# Patient Record
Sex: Female | Born: 1940 | Race: White | Hispanic: No | Marital: Married | Smoking: Former smoker
Health system: Southern US, Community
[De-identification: ages and names within clinical notes are randomized; demographics above are authoritative.]

## PROBLEM LIST (undated history)

## (undated) DIAGNOSIS — I82409 Acute embolism and thrombosis of unspecified deep veins of unspecified lower extremity: Secondary | ICD-10-CM

## (undated) DIAGNOSIS — I10 Essential (primary) hypertension: Secondary | ICD-10-CM

## (undated) DIAGNOSIS — N289 Disorder of kidney and ureter, unspecified: Secondary | ICD-10-CM

## (undated) DIAGNOSIS — I639 Cerebral infarction, unspecified: Secondary | ICD-10-CM

## (undated) DIAGNOSIS — E119 Type 2 diabetes mellitus without complications: Secondary | ICD-10-CM

## (undated) DIAGNOSIS — I5032 Chronic diastolic (congestive) heart failure: Secondary | ICD-10-CM

## (undated) DIAGNOSIS — I482 Chronic atrial fibrillation, unspecified: Secondary | ICD-10-CM

---

## 2008-06-05 ENCOUNTER — Ambulatory Visit: Payer: Self-pay | Admitting: Internal Medicine

## 2009-07-31 ENCOUNTER — Ambulatory Visit: Payer: Self-pay | Admitting: Family Medicine

## 2013-11-01 DIAGNOSIS — K551 Chronic vascular disorders of intestine: Secondary | ICD-10-CM | POA: Insufficient documentation

## 2014-03-18 ENCOUNTER — Ambulatory Visit: Payer: Self-pay | Admitting: Family Medicine

## 2015-05-01 ENCOUNTER — Other Ambulatory Visit: Payer: Self-pay | Admitting: Family Medicine

## 2015-05-01 DIAGNOSIS — Z1231 Encounter for screening mammogram for malignant neoplasm of breast: Secondary | ICD-10-CM

## 2015-05-04 ENCOUNTER — Ambulatory Visit
Admission: RE | Admit: 2015-05-04 | Discharge: 2015-05-04 | Disposition: A | Discharge status: Home / Self Care | Payer: Medicare Other | Source: Ambulatory Visit | Attending: Family Medicine | Admitting: Family Medicine

## 2015-05-04 DIAGNOSIS — Z1231 Encounter for screening mammogram for malignant neoplasm of breast: Secondary | ICD-10-CM | POA: Insufficient documentation

## 2017-12-03 DIAGNOSIS — N183 Chronic kidney disease, stage 3 unspecified: Secondary | ICD-10-CM | POA: Insufficient documentation

## 2019-06-04 DIAGNOSIS — Z86718 Personal history of other venous thrombosis and embolism: Secondary | ICD-10-CM | POA: Insufficient documentation

## 2019-09-07 DIAGNOSIS — I701 Atherosclerosis of renal artery: Secondary | ICD-10-CM | POA: Insufficient documentation

## 2019-12-28 DIAGNOSIS — K922 Gastrointestinal hemorrhage, unspecified: Secondary | ICD-10-CM | POA: Insufficient documentation

## 2020-02-19 ENCOUNTER — Emergency Department: Payer: Medicare Other

## 2020-02-19 ENCOUNTER — Inpatient Hospital Stay
Admission: EM | Admit: 2020-02-19 | Discharge: 2020-02-23 | DRG: 481 | Disposition: A | Discharge status: Skilled Nursing Facility | Payer: Medicare Other | Source: Skilled Nursing Facility | Attending: Internal Medicine | Admitting: Internal Medicine

## 2020-02-19 ENCOUNTER — Other Ambulatory Visit: Payer: Self-pay

## 2020-02-19 DIAGNOSIS — Z7984 Long term (current) use of oral hypoglycemic drugs: Secondary | ICD-10-CM

## 2020-02-19 DIAGNOSIS — I110 Hypertensive heart disease with heart failure: Secondary | ICD-10-CM | POA: Diagnosis present

## 2020-02-19 DIAGNOSIS — S72002A Fracture of unspecified part of neck of left femur, initial encounter for closed fracture: Secondary | ICD-10-CM | POA: Diagnosis present

## 2020-02-19 DIAGNOSIS — E785 Hyperlipidemia, unspecified: Secondary | ICD-10-CM | POA: Diagnosis present

## 2020-02-19 DIAGNOSIS — I639 Cerebral infarction, unspecified: Secondary | ICD-10-CM | POA: Diagnosis present

## 2020-02-19 DIAGNOSIS — E041 Nontoxic single thyroid nodule: Secondary | ICD-10-CM | POA: Diagnosis present

## 2020-02-19 DIAGNOSIS — E119 Type 2 diabetes mellitus without complications: Secondary | ICD-10-CM | POA: Diagnosis present

## 2020-02-19 DIAGNOSIS — Y92129 Unspecified place in nursing home as the place of occurrence of the external cause: Secondary | ICD-10-CM | POA: Diagnosis not present

## 2020-02-19 DIAGNOSIS — Z20822 Contact with and (suspected) exposure to covid-19: Secondary | ICD-10-CM | POA: Diagnosis present

## 2020-02-19 DIAGNOSIS — S72142A Displaced intertrochanteric fracture of left femur, initial encounter for closed fracture: Principal | ICD-10-CM | POA: Diagnosis present

## 2020-02-19 DIAGNOSIS — F0390 Unspecified dementia without behavioral disturbance: Secondary | ICD-10-CM | POA: Diagnosis not present

## 2020-02-19 DIAGNOSIS — Z79899 Other long term (current) drug therapy: Secondary | ICD-10-CM

## 2020-02-19 DIAGNOSIS — G309 Alzheimer's disease, unspecified: Secondary | ICD-10-CM | POA: Diagnosis not present

## 2020-02-19 DIAGNOSIS — R52 Pain, unspecified: Secondary | ICD-10-CM

## 2020-02-19 DIAGNOSIS — Z66 Do not resuscitate: Secondary | ICD-10-CM | POA: Diagnosis present

## 2020-02-19 DIAGNOSIS — I7090 Unspecified atherosclerosis: Secondary | ICD-10-CM

## 2020-02-19 DIAGNOSIS — Z86718 Personal history of other venous thrombosis and embolism: Secondary | ICD-10-CM

## 2020-02-19 DIAGNOSIS — I10 Essential (primary) hypertension: Secondary | ICD-10-CM | POA: Diagnosis present

## 2020-02-19 DIAGNOSIS — D62 Acute posthemorrhagic anemia: Secondary | ICD-10-CM | POA: Diagnosis not present

## 2020-02-19 DIAGNOSIS — M79662 Pain in left lower leg: Secondary | ICD-10-CM | POA: Diagnosis not present

## 2020-02-19 DIAGNOSIS — Z8673 Personal history of transient ischemic attack (TIA), and cerebral infarction without residual deficits: Secondary | ICD-10-CM | POA: Diagnosis not present

## 2020-02-19 DIAGNOSIS — I5032 Chronic diastolic (congestive) heart failure: Secondary | ICD-10-CM | POA: Diagnosis present

## 2020-02-19 DIAGNOSIS — I482 Chronic atrial fibrillation, unspecified: Secondary | ICD-10-CM | POA: Diagnosis present

## 2020-02-19 DIAGNOSIS — W010XXA Fall on same level from slipping, tripping and stumbling without subsequent striking against object, initial encounter: Secondary | ICD-10-CM | POA: Diagnosis present

## 2020-02-19 DIAGNOSIS — Z87891 Personal history of nicotine dependence: Secondary | ICD-10-CM

## 2020-02-19 DIAGNOSIS — W19XXXA Unspecified fall, initial encounter: Secondary | ICD-10-CM | POA: Diagnosis present

## 2020-02-19 DIAGNOSIS — F32A Depression, unspecified: Secondary | ICD-10-CM | POA: Diagnosis present

## 2020-02-19 DIAGNOSIS — K219 Gastro-esophageal reflux disease without esophagitis: Secondary | ICD-10-CM | POA: Diagnosis present

## 2020-02-19 DIAGNOSIS — D649 Anemia, unspecified: Secondary | ICD-10-CM | POA: Diagnosis present

## 2020-02-19 DIAGNOSIS — Z8249 Family history of ischemic heart disease and other diseases of the circulatory system: Secondary | ICD-10-CM

## 2020-02-19 DIAGNOSIS — Z419 Encounter for procedure for purposes other than remedying health state, unspecified: Secondary | ICD-10-CM

## 2020-02-19 DIAGNOSIS — Z888 Allergy status to other drugs, medicaments and biological substances status: Secondary | ICD-10-CM | POA: Diagnosis not present

## 2020-02-19 DIAGNOSIS — F0280 Dementia in other diseases classified elsewhere without behavioral disturbance: Secondary | ICD-10-CM | POA: Diagnosis not present

## 2020-02-19 DIAGNOSIS — F0150 Vascular dementia without behavioral disturbance: Secondary | ICD-10-CM | POA: Diagnosis present

## 2020-02-19 HISTORY — DX: Cerebral infarction, unspecified: I63.9

## 2020-02-19 HISTORY — DX: Chronic diastolic (congestive) heart failure: I50.32

## 2020-02-19 HISTORY — DX: Acute embolism and thrombosis of unspecified deep veins of unspecified lower extremity: I82.409

## 2020-02-19 HISTORY — DX: Essential (primary) hypertension: I10

## 2020-02-19 HISTORY — DX: Type 2 diabetes mellitus without complications: E11.9

## 2020-02-19 HISTORY — DX: Disorder of kidney and ureter, unspecified: N28.9

## 2020-02-19 HISTORY — DX: Chronic atrial fibrillation, unspecified: I48.20

## 2020-02-19 LAB — CBC WITH DIFFERENTIAL/PLATELET
Abs Immature Granulocytes: 0.03 10*3/uL (ref 0.00–0.07)
Basophils Absolute: 0.1 10*3/uL (ref 0.0–0.1)
Basophils Relative: 1 %
Eosinophils Absolute: 0.9 10*3/uL — ABNORMAL HIGH (ref 0.0–0.5)
Eosinophils Relative: 13 %
HCT: 31.4 % — ABNORMAL LOW (ref 36.0–46.0)
Hemoglobin: 9.3 g/dL — ABNORMAL LOW (ref 12.0–15.0)
Immature Granulocytes: 0 %
Lymphocytes Relative: 22 %
Lymphs Abs: 1.5 10*3/uL (ref 0.7–4.0)
MCH: 26.1 pg (ref 26.0–34.0)
MCHC: 29.6 g/dL — ABNORMAL LOW (ref 30.0–36.0)
MCV: 88.2 fL (ref 80.0–100.0)
Monocytes Absolute: 0.7 10*3/uL (ref 0.1–1.0)
Monocytes Relative: 10 %
Neutro Abs: 3.7 10*3/uL (ref 1.7–7.7)
Neutrophils Relative %: 54 %
Platelets: 290 10*3/uL (ref 150–400)
RBC: 3.56 MIL/uL — ABNORMAL LOW (ref 3.87–5.11)
RDW: 18 % — ABNORMAL HIGH (ref 11.5–15.5)
WBC: 6.8 10*3/uL (ref 4.0–10.5)
nRBC: 0 % (ref 0.0–0.2)

## 2020-02-19 LAB — COMPREHENSIVE METABOLIC PANEL
ALT: 11 U/L (ref 0–44)
AST: 17 U/L (ref 15–41)
Albumin: 3.2 g/dL — ABNORMAL LOW (ref 3.5–5.0)
Alkaline Phosphatase: 87 U/L (ref 38–126)
Anion gap: 12 (ref 5–15)
BUN: 16 mg/dL (ref 8–23)
CO2: 23 mmol/L (ref 22–32)
Calcium: 8.7 mg/dL — ABNORMAL LOW (ref 8.9–10.3)
Chloride: 106 mmol/L (ref 98–111)
Creatinine, Ser: 0.96 mg/dL (ref 0.44–1.00)
GFR, Estimated: >60 mL/min (ref >60)
Glucose, Bld: 160 mg/dL — ABNORMAL HIGH (ref 70–99)
Potassium: 3.7 mmol/L (ref 3.5–5.1)
Sodium: 141 mmol/L (ref 135–145)
Total Bilirubin: 0.5 mg/dL (ref 0.3–1.2)
Total Protein: 6.6 g/dL (ref 6.5–8.1)

## 2020-02-19 LAB — CBG MONITORING, ED: Glucose-Capillary: 166 mg/dL — ABNORMAL HIGH (ref 70–99)

## 2020-02-19 LAB — RESP PANEL BY RT-PCR (FLU A&B, COVID) ARPGX2
Influenza A by PCR: NEGATIVE
Influenza B by PCR: NEGATIVE
SARS Coronavirus 2 by RT PCR: NEGATIVE

## 2020-02-19 LAB — APTT: aPTT: 29 seconds (ref 24–36)

## 2020-02-19 LAB — MRSA PCR SCREENING: MRSA by PCR: NEGATIVE

## 2020-02-19 LAB — GLUCOSE, CAPILLARY
Glucose-Capillary: 143 mg/dL — ABNORMAL HIGH (ref 70–99)
Glucose-Capillary: 169 mg/dL — ABNORMAL HIGH (ref 70–99)

## 2020-02-19 LAB — PROTIME-INR
INR: 1.2 (ref 0.8–1.2)
Prothrombin Time: 14.6 seconds (ref 11.4–15.2)

## 2020-02-19 LAB — BRAIN NATRIURETIC PEPTIDE: B Natriuretic Peptide: 1080 pg/mL — ABNORMAL HIGH (ref 0.0–100.0)

## 2020-02-19 MED ORDER — DONEPEZIL HCL 5 MG PO TABS
10.0000 mg | ORAL_TABLET | Freq: Every day | ORAL | Status: DC
  Administered 2020-02-19 – 2020-02-22 (×4): 10 mg via ORAL
  Filled 2020-02-19 (×4): qty 2

## 2020-02-19 MED ORDER — FENTANYL CITRATE (PF) 100 MCG/2ML IJ SOLN
50.0000 ug | Freq: Once | INTRAMUSCULAR | Status: AC
Start: 2020-02-19 — End: 2020-02-19
  Administered 2020-02-19: 50 ug via INTRAVENOUS
  Filled 2020-02-19: qty 2

## 2020-02-19 MED ORDER — BENAZEPRIL HCL 20 MG PO TABS
40.0000 mg | ORAL_TABLET | Freq: Every day | ORAL | Status: DC
  Administered 2020-02-19 – 2020-02-23 (×4): 40 mg via ORAL
  Filled 2020-02-19 (×5): qty 2

## 2020-02-19 MED ORDER — INSULIN ASPART 100 UNIT/ML ~~LOC~~ SOLN
0.0000 [IU] | Freq: Three times a day (TID) | SUBCUTANEOUS | Status: DC
  Administered 2020-02-19: 2 [IU] via SUBCUTANEOUS
  Filled 2020-02-19: qty 1

## 2020-02-19 MED ORDER — ONDANSETRON HCL 4 MG/2ML IJ SOLN
4.0000 mg | Freq: Three times a day (TID) | INTRAMUSCULAR | Status: DC | PRN
  Administered 2020-02-19 – 2020-02-20 (×2): 4 mg via INTRAVENOUS
  Filled 2020-02-19 (×2): qty 2

## 2020-02-19 MED ORDER — INSULIN ASPART 100 UNIT/ML ~~LOC~~ SOLN: 0.0000 [IU] | Freq: Every day | SUBCUTANEOUS | Status: DC

## 2020-02-19 MED ORDER — METFORMIN HCL ER 500 MG PO TB24
500.0000 mg | ORAL_TABLET | Freq: Every day | ORAL | Status: DC
  Administered 2020-02-21 – 2020-02-23 (×3): 500 mg via ORAL
  Filled 2020-02-19 (×5): qty 1

## 2020-02-19 MED ORDER — ATORVASTATIN CALCIUM 20 MG PO TABS
40.0000 mg | ORAL_TABLET | Freq: Every day | ORAL | Status: DC
  Administered 2020-02-19 – 2020-02-22 (×4): 40 mg via ORAL
  Filled 2020-02-19 (×4): qty 2

## 2020-02-19 MED ORDER — HYDROMORPHONE HCL 1 MG/ML IJ SOLN
1.0000 mg | Freq: Once | INTRAMUSCULAR | Status: AC
  Administered 2020-02-19: 1 mg via INTRAVENOUS
  Filled 2020-02-19: qty 1

## 2020-02-19 MED ORDER — ENSURE MAX PROTEIN PO LIQD
11.0000 [oz_av] | Freq: Every day | ORAL | Status: DC
  Administered 2020-02-21 – 2020-02-22 (×2): 11 [oz_av] via ORAL
  Filled 2020-02-19: qty 330

## 2020-02-19 MED ORDER — TRANEXAMIC ACID-NACL 1000-0.7 MG/100ML-% IV SOLN
1000.0000 mg | Freq: Once | INTRAVENOUS | Status: AC
  Administered 2020-02-19: 1000 mg via INTRAVENOUS
  Filled 2020-02-19: qty 100

## 2020-02-19 MED ORDER — MORPHINE SULFATE (PF) 2 MG/ML IV SOLN
0.5000 mg | INTRAVENOUS | Status: DC | PRN
  Administered 2020-02-19: 0.5 mg via INTRAVENOUS
  Filled 2020-02-19: qty 1

## 2020-02-19 MED ORDER — ACETAMINOPHEN 325 MG PO TABS: 650.0000 mg | ORAL_TABLET | Freq: Four times a day (QID) | ORAL | Status: DC | PRN

## 2020-02-19 MED ORDER — JUVEN PO PACK
1.0000 | PACK | Freq: Two times a day (BID) | ORAL | Status: DC
  Administered 2020-02-20 – 2020-02-23 (×7): 1 via ORAL

## 2020-02-19 MED ORDER — PANTOPRAZOLE SODIUM 40 MG PO TBEC
40.0000 mg | DELAYED_RELEASE_TABLET | Freq: Every day | ORAL | Status: DC
  Administered 2020-02-19 – 2020-02-23 (×4): 40 mg via ORAL
  Filled 2020-02-19 (×4): qty 1

## 2020-02-19 MED ORDER — CEFAZOLIN SODIUM-DEXTROSE 2-4 GM/100ML-% IV SOLN
2.0000 g | Freq: Once | INTRAVENOUS | Status: AC
  Administered 2020-02-19: 2 g via INTRAVENOUS
  Filled 2020-02-19: qty 100

## 2020-02-19 MED ORDER — OXYCODONE-ACETAMINOPHEN 5-325 MG PO TABS
1.0000 | ORAL_TABLET | ORAL | Status: DC | PRN
  Administered 2020-02-19 – 2020-02-20 (×3): 1 via ORAL
  Filled 2020-02-19 (×3): qty 1

## 2020-02-19 MED ORDER — ONDANSETRON HCL 4 MG/2ML IJ SOLN: 4.0000 mg | Freq: Once | INTRAMUSCULAR | Status: AC

## 2020-02-19 MED ORDER — ONDANSETRON HCL 4 MG/2ML IJ SOLN
INTRAMUSCULAR | Status: AC
  Administered 2020-02-19: 4 mg via INTRAVENOUS
  Filled 2020-02-19: qty 2

## 2020-02-19 MED ORDER — ACETAMINOPHEN 500 MG PO TABS
1000.0000 mg | ORAL_TABLET | Freq: Once | ORAL | Status: DC
  Filled 2020-02-19: qty 2

## 2020-02-19 MED ORDER — HYDROMORPHONE HCL 1 MG/ML IJ SOLN
0.5000 mg | Freq: Once | INTRAMUSCULAR | Status: AC
  Administered 2020-02-19: 0.5 mg via INTRAVENOUS
  Filled 2020-02-19: qty 1

## 2020-02-19 MED ORDER — SENNOSIDES-DOCUSATE SODIUM 8.6-50 MG PO TABS: 1.0000 | ORAL_TABLET | Freq: Every evening | ORAL | Status: DC | PRN

## 2020-02-19 MED ORDER — VITAMIN D 25 MCG (1000 UNIT) PO TABS
2000.0000 [IU] | ORAL_TABLET | Freq: Every day | ORAL | Status: DC
  Administered 2020-02-19 – 2020-02-23 (×4): 2000 [IU] via ORAL
  Filled 2020-02-19 (×4): qty 2

## 2020-02-19 MED ORDER — HYDROMORPHONE HCL 1 MG/ML IJ SOLN
0.5000 mg | INTRAMUSCULAR | Status: DC | PRN
  Administered 2020-02-19 – 2020-02-20 (×3): 0.5 mg via INTRAVENOUS
  Filled 2020-02-19 (×3): qty 1

## 2020-02-19 MED ORDER — ADULT MULTIVITAMIN W/MINERALS CH
1.0000 | ORAL_TABLET | Freq: Every day | ORAL | Status: DC
  Administered 2020-02-21 – 2020-02-23 (×3): 1 via ORAL
  Filled 2020-02-19 (×3): qty 1

## 2020-02-19 MED ORDER — HYDRALAZINE HCL 20 MG/ML IJ SOLN
5.0000 mg | INTRAMUSCULAR | Status: DC | PRN
  Administered 2020-02-22: 5 mg via INTRAVENOUS
  Filled 2020-02-19: qty 1

## 2020-02-19 MED ORDER — SERTRALINE HCL 50 MG PO TABS
50.0000 mg | ORAL_TABLET | Freq: Every day | ORAL | Status: DC
  Administered 2020-02-19 – 2020-02-23 (×4): 50 mg via ORAL
  Filled 2020-02-19 (×4): qty 1

## 2020-02-19 MED ORDER — INSULIN ASPART 100 UNIT/ML ~~LOC~~ SOLN: 0.0000 [IU] | SUBCUTANEOUS | Status: DC

## 2020-02-19 MED ORDER — MORPHINE SULFATE (PF) 4 MG/ML IV SOLN
4.0000 mg | Freq: Once | INTRAVENOUS | Status: AC
  Administered 2020-02-19: 4 mg via INTRAVENOUS
  Filled 2020-02-19: qty 1

## 2020-02-19 MED ORDER — METHOCARBAMOL 500 MG PO TABS
500.0000 mg | ORAL_TABLET | Freq: Three times a day (TID) | ORAL | Status: DC | PRN
  Administered 2020-02-19 – 2020-02-23 (×5): 500 mg via ORAL
  Filled 2020-02-19 (×7): qty 1

## 2020-02-19 MED ORDER — VITAMIN B-12 1000 MCG PO TABS
1000.0000 ug | ORAL_TABLET | Freq: Every day | ORAL | Status: DC
  Administered 2020-02-19: 1000 ug via ORAL
  Filled 2020-02-19: qty 1

## 2020-02-19 NOTE — ED Notes (Signed)
Patient noted to be resting. Intermittent groans and expressions of pain. Patient is no longer nauseous.

## 2020-02-19 NOTE — ED Provider Notes (Signed)
  Patient received in signout from Dr. Antoine Primas pending callback from [facility] transfer center and orthopedics for possible transfer.  Briefly, patient resides at a local SNF, mechanical fall last night causing left-sided comminuted intertrochanteric femur fracture, but family is demanding transfer to [facility] for fixation.  I speak with orthopedic surgeon at [facility], Dr. Allena Katz, who indicates that they were unable to accept transfer due to lack of bed availability for need for tertiary care facility.  I relayed this information to daughter and son of patient over the phone, suggesting and recommending admission to our facility for fixation.  They have multiple questions, but are ultimately agreeable to admission to [facility] for orthopedic fixation of her left hip.  I discussed the case with Dr. Signa Kell orthopedics, and Dr. Clyde Lundborg Hospitalist.    Clinical Course as of 02/19/20 0920  Sat Feb 19, 2020  0838 I spoke with daughter, Lethea Killings, she also conferences in her brother on the call,  [DS]  [phone] Dr. Dalene Carrow, ortho at [facility], unable to accept transfer [DS]    Clinical Course User Index [DS] Delton Prairie, MD      Delton Prairie, MD 02/19/20 [phone]

## 2020-02-19 NOTE — ED Notes (Signed)
Purewick replaced. Pt states need to void.

## 2020-02-19 NOTE — ED Notes (Addendum)
Patient's daughter and son request that patient be transferred to [facility] if she needs surgery. EDP informed.  Patient's daughter updated by this RN after receving permission from patient.  Virginia Mann has requested to be the point of contact for family: [phone].

## 2020-02-19 NOTE — Progress Notes (Signed)
Initial Nutrition Assessment  DOCUMENTATION CODES:   Not applicable  INTERVENTION:  Ensure Max po daily, each supplement provides 150 kcal and 30 grams of protein  Juven BID, each packet provides 95 calories, 2.5 grams of protein (collagen), and 9.8 grams of carbohydrate (3 grams sugar); also contains 7 grams of L-arginine and L-glutamine, 300 mg vitamin C, 15 mg vitamin E, 1.2 mcg vitamin B-12, 9.5 mg zinc, 200 mg calcium, and 1.5 g  Calcium Beta-hydroxy-Beta-methylbutyrate to support wound healing  MVI with minerals po daily  NUTRITION DIAGNOSIS:   Increased nutrient needs related to post-op healing,hip fracture as evidenced by estimated needs.    GOAL:   Patient will meet greater than or equal to 90% of their needs    MONITOR:   Labs,I & O's,Supplement acceptance,Skin,PO intake,Weight trends  REASON FOR ASSESSMENT:   Consult Assessment of nutrition requirement/status (hip fx)  ASSESSMENT:  80 year old female admitted with left hip fracture after fall at facility. Past medical history significant of HTN, HLD, DM2, stroke, GERD, depression, atrial fibrillation on anticoagulants, GIB, vascular dementia, and chronic dCHF.  Pt is s/p ORIF of left hip 2/12  Attempted to reach pt via phone, however no answer. Per notes, pt with recent arrival to floor this afternoon with 10/10 pain, prn medications given. Diet advanced to HH/CM, no documented intakes at this time. Suspect decreased appetite and intake related to pain. Pt has increased protein needs to promote post-op healing. Will order Ensure Max daily to help her meet her needs as well as Juven to support post-op wound healing.   Limited recent wt history for review. Per encounters, weights appear fairly stable over the past few months. At office visit on 10/29 she weighed 62.4 kg, office visit on 12/8 pt weighed 63.5 kg, on 12/20 [facility] hospital admission she weighed 61.9 kg and currently pt weighs 64.4 kg  Medications  reviewed and include: D3, Aricept, Metformin, Protonix, Zoloft, B12  Labs: CBGs 143,166 No A1c for review  NUTRITION - FOCUSED PHYSICAL EXAM:  Unable to complete at this time  Diet Order:   Diet Order            Diet NPO time specified Except for: Ice Chips, Sips with Meds  Diet effective midnight           Diet NPO time specified  Diet effective midnight           Diet heart healthy/carb modified Room service appropriate? Yes; Fluid consistency: Thin  Diet effective now                 EDUCATION NEEDS:   No education needs have been identified at this time  Skin:  Skin Assessment: Skin Integrity Issues: Skin Integrity Issues:: Incisions Incisions: closed; left hip  Last BM:     Height:   Ht Readings from Last 1 Encounters:  02/19/20 5\' 1"  (1.549 m)    Weight:   Wt Readings from Last 1 Encounters:  02/19/20 64.4 kg    BMI:  Body mass index is 26.83 kg/m.  Estimated Nutritional Needs:   Kcal:  1700-1900  Protein:  90-105  Fluid:  1.6 L/day    04/18/20, RD, LDN Clinical Nutrition After Hours/Weekend Pager # in Amion

## 2020-02-19 NOTE — Progress Notes (Signed)
Pt arrived to 140A, pain 10/10 prn medications given. VSS at this time, dtr at bedside.

## 2020-02-19 NOTE — ED Provider Notes (Signed)
[facility] Emergency Department Provider Note  ____________________________________________   Event Date/Time   First MD Initiated Contact with Patient 02/19/20 [phone]     (approximate)  I have reviewed the triage vital signs and the nursing notes.   HISTORY  Chief Complaint Fall   HPI Virginia Mann is a 80 y.o. female with past medical history of HTN, HDL, DM, chronic A. fib not anticoagulated and rate controlled, and vascular dementia who presents via EMS from nursing facility after she had a fall.  She states she slipped hitting her right hip and has been able to walk on the right hip since.  She denies any other pain does not think she hit her head and had LOC.  Denies pain in her right ankle, knee, or the left lower extremity, upper extremities chest abdomen back head or neck.  Patient notes she is otherwise been in her usual state of health without any recent fevers, chills, cough, vomiting, diarrhea, dysuria, rash or any other acute sick symptoms or recent falls.           Past Medical History:  Diagnosis Date  . Diabetes mellitus without complication (HCC)   . Renal disorder     There are no problems to display for this patient.   History reviewed. No pertinent surgical history.  Prior to Admission medications   Not on File    Allergies Patient has no allergy information on record.  History reviewed. No pertinent family history.  Social History Social History   Tobacco Use  . Smoking status: Former Smoker    Types: Cigarettes  . Smokeless tobacco: Never Used  Vaping Use  . Vaping Use: Never used  Substance Use Topics  . Alcohol use: Not Currently  . Drug use: Not Currently    Review of Systems  Review of Systems  Constitutional: Negative for chills and fever.  HENT: Negative for sore throat.   Eyes: Negative for pain.  Respiratory: Negative for cough and stridor.   Cardiovascular: Negative for chest pain.   Gastrointestinal: Negative for vomiting.  Musculoskeletal: Positive for joint pain ( R hip\) and myalgias ( R hip).  Skin: Negative for rash.  Neurological: Negative for seizures, loss of consciousness and headaches.  Psychiatric/Behavioral: Negative for suicidal ideas.  All other systems reviewed and are negative.     ____________________________________________   PHYSICAL EXAM:  VITAL SIGNS: ED Triage Vitals  Enc Vitals Group     BP      Pulse      Resp      Temp      Temp src      SpO2      Weight      Height      Head Circumference      Peak Flow      Pain Score      Pain Loc      Pain Edu?      Excl. in GC?    Vitals:   02/19/20 0630 02/19/20 0700  BP: (!) 162/68 (!) 161/96  Pulse: 80 65  Resp: (!) 22 12  Temp:    SpO2: 100%    Physical Exam Vitals and nursing note reviewed.  Constitutional:      General: She is not in acute distress.    Appearance: She is well-developed and well-nourished.  HENT:     Head: Normocephalic and atraumatic.     Right Ear: External ear normal.     Left Ear:  External ear normal.     Nose: Nose normal.  Eyes:     Conjunctiva/sclera: Conjunctivae normal.  Cardiovascular:     Rate and Rhythm: Normal rate and regular rhythm.     Heart sounds: No murmur heard.   Pulmonary:     Effort: Pulmonary effort is normal. No respiratory distress.     Breath sounds: Normal breath sounds.  Abdominal:     Palpations: Abdomen is soft.     Tenderness: There is no abdominal tenderness.  Musculoskeletal:        General: No edema.     Cervical back: Neck supple.     Right hip: Tenderness present. Decreased range of motion. Decreased strength.  Skin:    General: Skin is warm and dry.  Neurological:     Mental Status: She is alert.  Psychiatric:        Mood and Affect: Mood and affect normal.     No tenderness step-offs deformities over the C/T/L-spine.  2+ bilateral radial pulses.  Cranial nerves II to XII grossly intact.  Patient  has full strength and range of motion in the bilateral upper extremities and left lower extremity.  There is no tenderness effusion deformity or other overlying skin changes of the bilateral shoulders, elbows, wrists, left hip, bilateral knees or bilateral ankles.  Sensation is intact to light touch of all extremities.  Patient is oriented to year but not date. ____________________________________________   LABS (all labs ordered are listed, but only abnormal results are displayed)  Labs Reviewed  CBC WITH DIFFERENTIAL/PLATELET - Abnormal; Notable for the following components:      Result Value   RBC 3.56 (*)    Hemoglobin 9.3 (*)    HCT 31.4 (*)    MCHC 29.6 (*)    RDW 18.0 (*)    Eosinophils Absolute 0.9 (*)    All other components within normal limits  COMPREHENSIVE METABOLIC PANEL - Abnormal; Notable for the following components:   Glucose, Bld 160 (*)    Calcium 8.7 (*)    Albumin 3.2 (*)    All other components within normal limits  RESP PANEL BY RT-PCR (FLU A&B, COVID) [id number]  PROTIME-INR  TYPE AND SCREEN   ____________________________________________  EKG  A. fib with a ventricular rate of 67, normal axis, normal levels, no clear evidence of acute ischemia or other significant underlying arrhythmia. ____________________________________________  RADIOLOGY  ED MD interpretation: CT head and C-spine showed no evidence of acute injury.  Chest x-ray shows no rib fracture or pneumothorax or other clear process although is a very small area of the left lower lung base that could be atelectasis versus some scarring.  Plain film the left hip shows comminuted trochanteric fracture.  Official radiology report(s): DG Chest 1 View  Result Date: 02/19/2020 CLINICAL DATA:  80 year old female status post unwitnessed fall. Left femur fracture. EXAM: CHEST  1 VIEW COMPARISON:  None. FINDINGS: Portable AP semi upright view at 0356 hours. Normal lung volumes. Cardiac size at the upper  limits of normal. Other mediastinal contours are within normal limits. Visualized tracheal air column is within normal limits. No pneumothorax, pulmonary edema or consolidation. No definite pleural effusion or pulmonary edema. Streaky left lung base opacity near the cardiac apex might be atelectasis or scarring. Paucity of bowel gas in the upper abdomen. No acute osseous abnormality identified. IMPRESSION: 1. Small area of opacity at the left lung base is nonspecific. Atelectasis or scarring favored over infection or aspiration. 2. No other acute  cardiopulmonary abnormality. Electronically Signed   By: Odessa Fleming M.D.   On: 02/19/2020 04:33   CT Head Wo Contrast  Result Date: 02/19/2020 CLINICAL DATA:  Unwitnessed fall. EXAM: CT HEAD WITHOUT CONTRAST CT CERVICAL SPINE WITHOUT CONTRAST TECHNIQUE: Multidetector CT imaging of the head and cervical spine was performed following the standard protocol without intravenous contrast. Multiplanar CT image reconstructions of the cervical spine were also generated. COMPARISON:  None. FINDINGS: CT HEAD FINDINGS Brain: Cerebral ventricle sizes are concordant with the degree of cerebral volume loss. Chronic right occipital lobe infarction. Patchy and confluent areas of decreased attenuation are noted throughout the deep and periventricular white matter of the cerebral hemispheres bilaterally, compatible with chronic microvascular ischemic disease. No evidence of large-territorial acute infarction. No parenchymal hemorrhage. No mass lesion. No extra-axial collection. No mass effect or midline shift. No hydrocephalus. Basilar cisterns are patent. Vascular: No hyperdense vessel. Atherosclerotic calcifications are present within the cavernous internal carotid and vertebral arteries. Skull: No acute fracture or focal lesion. Sinuses/Orbits: Paranasal sinuses and mastoid air cells are clear. Bilateral lens replacement. Otherwise the orbits are unremarkable. Other: None. CT CERVICAL  SPINE FINDINGS Alignment: Normal. Skull base and vertebrae: Multilevel degenerative changes of the spine most prominent at the C5-C6 level. No acute fracture. No aggressive appearing focal osseous lesion or focal pathologic process. Soft tissues and spinal canal: No prevertebral fluid or swelling. No visible canal hematoma. Upper chest: Unremarkable. Other: A 1.7 cm peripherally calcified hypodense nodule within the left thyroid gland. Atherosclerotic plaque of the carotid arteries within the neck. Atherosclerotic plaque of the aortic arch. IMPRESSION: 1. No acute intracranial abnormality. 2. No acute displaced fracture or traumatic listhesis of the cervical spine. 3. A 1.7 cm peripherally calcified hypodense nodule within the left thyroid gland. Recommend thyroid US (ref: J Am Coll Radiol. 2015 Feb;12(2): 143-50). In the setting of significant comorbidities or limited life expectancy, no follow-up recommended (ref: J Am Coll Radiol. 2015 Feb;12(2): 143-50). 4.  Aortic Atherosclerosis (ICD10-I70.0). Electronically Signed   By: Tish Frederickson M.D.   On: 02/19/2020 05:11   CT Cervical Spine Wo Contrast  Result Date: 02/19/2020 CLINICAL DATA:  Unwitnessed fall. EXAM: CT HEAD WITHOUT CONTRAST CT CERVICAL SPINE WITHOUT CONTRAST TECHNIQUE: Multidetector CT imaging of the head and cervical spine was performed following the standard protocol without intravenous contrast. Multiplanar CT image reconstructions of the cervical spine were also generated. COMPARISON:  None. FINDINGS: CT HEAD FINDINGS Brain: Cerebral ventricle sizes are concordant with the degree of cerebral volume loss. Chronic right occipital lobe infarction. Patchy and confluent areas of decreased attenuation are noted throughout the deep and periventricular white matter of the cerebral hemispheres bilaterally, compatible with chronic microvascular ischemic disease. No evidence of large-territorial acute infarction. No parenchymal hemorrhage. No mass  lesion. No extra-axial collection. No mass effect or midline shift. No hydrocephalus. Basilar cisterns are patent. Vascular: No hyperdense vessel. Atherosclerotic calcifications are present within the cavernous internal carotid and vertebral arteries. Skull: No acute fracture or focal lesion. Sinuses/Orbits: Paranasal sinuses and mastoid air cells are clear. Bilateral lens replacement. Otherwise the orbits are unremarkable. Other: None. CT CERVICAL SPINE FINDINGS Alignment: Normal. Skull base and vertebrae: Multilevel degenerative changes of the spine most prominent at the C5-C6 level. No acute fracture. No aggressive appearing focal osseous lesion or focal pathologic process. Soft tissues and spinal canal: No prevertebral fluid or swelling. No visible canal hematoma. Upper chest: Unremarkable. Other: A 1.7 cm peripherally calcified hypodense nodule within the left thyroid gland. Atherosclerotic plaque  of the carotid arteries within the neck. Atherosclerotic plaque of the aortic arch. IMPRESSION: 1. No acute intracranial abnormality. 2. No acute displaced fracture or traumatic listhesis of the cervical spine. 3. A 1.7 cm peripherally calcified hypodense nodule within the left thyroid gland. Recommend thyroid US (ref: J Am Coll Radiol. 2015 Feb;12(2): 143-50). In the setting of significant comorbidities or limited life expectancy, no follow-up recommended (ref: J Am Coll Radiol. 2015 Feb;12(2): 143-50). 4.  Aortic Atherosclerosis (ICD10-I70.0). Electronically Signed   By: Tish FredericksonMorgane  Naveau M.D.   On: 02/19/2020 05:11   DG HIP UNILAT WITH PELVIS 2-3 VIEWS LEFT  Result Date: 02/19/2020 CLINICAL DATA:  80 year old female status post unwitnessed fall. Left hip pain. EXAM: DG HIP (WITH OR WITHOUT PELVIS) 2-3V LEFT COMPARISON:  None. FINDINGS: Extensive aortoiliac and proximal right femoral artery vascular calcifications. Femoral heads remain normally located. The pelvis appears intact. Negative visible bowel gas  pattern. Grossly intact proximal right femur. Comminuted left intertrochanteric fracture with medial displacement 1/2 shaft width, over riding of 2-3 cm, anterior displacement 1/2 shaft width and mild posterior angulation. IMPRESSION: Comminuted left intertrochanteric fracture with displacement, overriding of fragments. Electronically Signed   By: Odessa FlemingH  Hall M.D.   On: 02/19/2020 04:32    ____________________________________________   PROCEDURES  Procedure(s) performed (including Critical Care):  .1-3 Lead EKG Interpretation Performed by: Gilles ChiquitoSmith, Yaquelin Langelier P, MD Authorized by: Gilles ChiquitoSmith, Jonel Sick P, MD     Interpretation: normal     ECG rate assessment: normal     Rhythm: atrial fibrillation     Ectopy: none     Conduction: normal       ____________________________________________   INITIAL IMPRESSION / ASSESSMENT AND PLAN / ED COURSE      Patient presents with above to history exam for assessment after mechanical, fall onto the right hip.  She is afebrile hemodynamically stable.  She has decreased strength and significant pain in the right hip.  She is neurovascularly intact distally and there are no other physical exam findings to suggest trauma to the head back face scalp neck upper extremities or left lower extremity.  She denies any recent sick symptoms.  However given she has some dementia will obtain CT head and C-spine as well.  CT head and C-spine are unremarkable.  Plain film left hip does show a comminuted left hip fracture.  This is likely operative and the chest x-ray was obtained that shows no clear acute intrathoracic process.  Plain film of the left hip does show comminuted left trochanteric fracture.  However patient is neurovascular intact distally.  Low suspicion for other occult or significant ventral injury at this time.   ECG shows known A. fib without any other significant abnormality.  CBC with WBC count of 6.8, hemoglobin of 9.3 without recent to compare to but  no other significant derangements.  CMP remarkable for glucose of 160 and no other significant actually metabolic derangements.  Type and screen sent.  Covid sent and found to be negative.  INR WNL  Shortly after patient arrived to emergency room her daughter did call and stated she wished for the [facility] to [facility]  I did reach out to the daughter and spoke to patient's son as well explained patient's diagnosis of hip fracture and need for likely operative management.  Explained that we have the capacity to do this and do this routinely in our hospital but daughter and son are adamant that I call [facility] first before reaching out to anyone  at [facility].  I explained that [facility] was at capacity and I did confirm this with [facility] transfer center but daughter stated she still wished for me to confirm that they would not accept patient for transfer before reaching out to [facility] [place].  I again tried to explain that they would be extremely unlikely accept her for transfer given we have have the capacity to care for her at this hospital but daughter still requested we hold off on any Ortho consult or admission here until hearing back from [place].  Care patient signed over to Dr. Deneen Harts approximately 0 730.  Plan is to follow-up with [facility] and likely admit here as I do not anticipate transfer to do at this time as I did confirm with transfer center that would be at least 2 days before she would get a bed even if she were excepted..         ____________________________________________   FINAL CLINICAL IMPRESSION(S) / ED DIAGNOSES  Final diagnoses:  Closed fracture of left hip, initial encounter (HCC)    Medications  acetaminophen (TYLENOL) tablet 1,000 mg (0 mg Oral Hold 02/19/20 0400)  HYDROmorphone (DILAUDID) injection 0.5 mg (has no administration in time range)  fentaNYL (SUBLIMAZE) injection 50 mcg (50 mcg Intravenous Given 02/19/20 0433)  ondansetron (ZOFRAN) injection 4 mg (4 mg  Intravenous Given 02/19/20 0510)  HYDROmorphone (DILAUDID) injection 0.5 mg (0.5 mg Intravenous Given 02/19/20 9702)     ED Discharge Orders    None       Note:  This document was prepared using Dragon voice recognition software and may include unintentional dictation errors.   Gilles Chiquito, MD 02/19/20 [phone]

## 2020-02-19 NOTE — ED Notes (Signed)
EDP informed of pt current pain level.

## 2020-02-19 NOTE — ED Notes (Signed)
Pt upon waking began writhing and moaning/groaning/calling out in pain. No relief with the 0.5mg  morphine prescribed. Admitting MD messaged, additional orders received. Pt in pain still at this time. But does seem to be slightly lessened. Will monitor for improvement

## 2020-02-19 NOTE — ED Triage Notes (Signed)
Patient from [facility] via [facility] with c/o unwitnessed fall with L hip pain. Denies LOC, denies hitting head. NAD noted, VSS.

## 2020-02-19 NOTE — ED Notes (Signed)
Upon returning from CT patient is holding emesis bag and dry heaving c/o nausea. EDP informed, new orders placed.

## 2020-02-19 NOTE — ED Notes (Signed)
Replaced purewick. Pt states she did not remove it but it was found by her side. Pt continues to be in severe pain. Explained to pt that she should be getting surgery today and will let doctor know of pain level.

## 2020-02-19 NOTE — Progress Notes (Signed)
Full consult note to follow after formal examination of the patient. I have discussed the patient's medical history and proposed plan of care with the patient's daughter and son over the phone. Family in agreement to proceed with surgery.   Called by ED staff. Imaging reviewed.  - Plan for surgery tomorrow, likely morning - Recommend 1 dose of IV TXA especially given prior history of bleeding. - NPO after midnight - Pain control with PO +/- IV meds - Hold anticoagulation - Admit to Hospitalist team.

## 2020-02-19 NOTE — ED Notes (Signed)
Patient placed on purewick and able to urinate spontaneously. 250 mL clear, yellow urine emptied from suction canister.

## 2020-02-19 NOTE — H&P (Signed)
History and Physical    Virginia Mann ZOX:[id number] DOB: Apr 10, 1940 DOA: 02/19/2020  Referring MD/NP/PA:   PCP: Lynwood Dawley, MD   Patient coming from:  The patient is coming from SNF.  At baseline, pt is dependent for most of ADL.        Chief Complaint: fall and left hip pain  HPI: Virginia Mann is a 80 y.o. female with medical history significant of hypertension, hyperlipidemia, diabetes mellitus, stroke, GERD, depression, atrial fibrillation on anticoagulants, GI bleeding, vascular dementia, DVT, who presents with fall and left hip pain.  Pt states that she accidentally slipped and fell in facility, injured her left hip, causing severe pain.  The pain is constant, severe, sharp, nonradiating.  Patient denies loss of consciousness.  No head or neck injury. Patient denies chest pain, shortness breath, cough, fever or chills.  No nausea vomiting, diarrhea, abdominal pain, symptoms of UTI.   Of note, per EDP, family wants pt be transferred to [facility]. Dr. Katrinka Blazing of ED spoke with orthopedic surgeon at [facility]. They were unable to accept transfer due to lack of bed availability.  ED Course: pt was found to have WBC 6.8, hemoglobin 9.3 (10.5 on 01/20/2020), negative Covid PCR, electrolytes renal function okay, temperature normal, blood pressure 132/61, heart rate is 74, RR 22, 13, oxygen saturation 95% on room air.  Chest x-ray showed a small opacity in left basilar area.  X-ray of left hip showed comminuted left intertrochanteric fracture with displacement, overriding of fragments. CT-head negative for acute intracranial abnormalities CT of C-spine negative for fracture.  Patient is admitted to MedSurg bed as inpatient. Dr. Allena Katz of ortho and Dr. Lady Gary of card are consulted.   Review of Systems:   General: no fevers, chills, no body weight gain, has fatigue HEENT: no blurry vision, hearing changes or sore throat Respiratory: no dyspnea, coughing, wheezing CV: no chest pain, no  palpitations GI: no nausea, vomiting, abdominal pain, diarrhea, constipation GU: no dysuria, burning on urination, increased urinary frequency, hematuria  Ext: no leg edema Neuro: no unilateral weakness, numbness, or tingling, no vision change or hearing loss. Has fall Skin: no rash, no skin tear. MSK: has left hip pain. Heme: No easy bruising.  Travel history: No recent long distant travel.  Allergy:  Allergies  Allergen Reactions  . Apixaban Itching and Rash    Past Medical History:  Diagnosis Date  . Chronic a-fib (HCC)   . Chronic diastolic (congestive) heart failure (HCC)   . Diabetes mellitus without complication (HCC)   . DVT (deep venous thrombosis) (HCC)   . HTN (hypertension)   . Renal disorder   . Stroke [facility])     History reviewed. No pertinent surgical history.  Social History:  reports that she has quit smoking. Her smoking use included cigarettes. She has never used smokeless tobacco. She reports previous alcohol use. She reports previous drug use.  Family History:  Family History  Problem Relation Age of Onset  . CAD Father   . Hypertension Father      Prior to Admission medications   Not on File    Physical Exam: Vitals:   02/19/20 1430 02/19/20 1500 02/19/20 1530 02/19/20 1638  BP: 135/62 130/69 128/89 (!) 149/73  Pulse: 86 75 (!) 102 76  Resp:   18 17  Temp:    98.1 F (36.7 C)  TempSrc:      SpO2: 97% 94% 97% 93%  Weight:      Height:  General: Not in acute distress HEENT:       Eyes: PERRL, EOMI, no scleral icterus.       ENT: No discharge from the ears and nose, no pharynx injection, no tonsillar enlargement.        Neck: No JVD, no bruit, no mass felt. Heme: No neck lymph node enlargement. Cardiac: S1/S2, RRR, No murmurs, No gallops or rubs. Respiratory: No rales, wheezing, rhonchi or rubs. GI: Soft, nondistended, nontender, no rebound pain, no organomegaly, BS present. GU: No hematuria Ext: has trace leg edema bilaterally.  1+DP/PT pulse bilaterally. Musculoskeletal: Has tenderness in left hip.  Left leg is an externally rotated Skin: No rashes.  Neuro: Alert, oriented X3, cranial nerves II-XII grossly intact, moves all extremities normally. Psych: Patient is not psychotic, no suicidal or hemocidal ideation.  Labs on Admission: I have personally reviewed following labs and imaging studies  CBC: Recent Labs  Lab 02/19/20 0355  WBC 6.8  NEUTROABS 3.7  HGB 9.3*  HCT 31.4*  MCV 88.2  PLT 290   Basic Metabolic Panel: Recent Labs  Lab 02/19/20 0355  NA 141  K 3.7  CL 106  CO2 23  GLUCOSE 160*  BUN 16  CREATININE 0.96  CALCIUM 8.7*   GFR: Estimated Creatinine Clearance: 41.5 mL/min (by C-G formula based on SCr of 0.96 mg/dL). Liver Function Tests: Recent Labs  Lab 02/19/20 0355  AST 17  ALT 11  ALKPHOS 87  BILITOT 0.5  PROT 6.6  ALBUMIN 3.2*   No results for input(s): LIPASE, AMYLASE in the last 168 hours. No results for input(s): AMMONIA in the last 168 hours. Coagulation Profile: Recent Labs  Lab 02/19/20 0355  INR 1.2   Cardiac Enzymes: No results for input(s): CKTOTAL, CKMB, CKMBINDEX, TROPONINI in the last 168 hours. BNP (last 3 results) No results for input(s): PROBNP in the last 8760 hours. HbA1C: No results for input(s): HGBA1C in the last 72 hours. CBG: Recent Labs  Lab 02/19/20 1238 02/19/20 1707  GLUCAP 166* 143*   Lipid Profile: No results for input(s): CHOL, HDL, LDLCALC, TRIG, CHOLHDL, LDLDIRECT in the last 72 hours. Thyroid Function Tests: No results for input(s): TSH, T4TOTAL, FREET4, T3FREE, THYROIDAB in the last 72 hours. Anemia Panel: No results for input(s): VITAMINB12, FOLATE, FERRITIN, TIBC, IRON, RETICCTPCT in the last 72 hours. Urine analysis: No results found for: COLORURINE, APPEARANCEUR, LABSPEC, PHURINE, GLUCOSEU, HGBUR, BILIRUBINUR, KETONESUR, PROTEINUR, UROBILINOGEN, NITRITE, LEUKOCYTESUR Sepsis  Labs: @LABRCNTIP (procalcitonin:4,lacticidven:4) ) Recent Results (from the past 240 hour(s))  Resp Panel by RT-PCR (Flu A&B, Covid) Nasopharyngeal Swab     Status: None   Collection Time: 02/19/20  4:03 AM   Specimen: Nasopharyngeal Swab; Nasopharyngeal(NP) swabs in vial transport medium  Result Value Ref Range Status   SARS Coronavirus 2 by RT PCR NEGATIVE NEGATIVE Final    Comment: (NOTE) SARS-CoV-2 target nucleic acids are NOT DETECTED.  The SARS-CoV-2 RNA is generally detectable in upper respiratory specimens during the acute phase of infection. The lowest concentration of SARS-CoV-2 viral copies this assay can detect is 138 copies/mL. A negative result does not preclude SARS-Cov-2 infection and should not be used as the sole basis for treatment or other patient management decisions. A negative result may occur with  improper specimen collection/handling, submission of specimen other than nasopharyngeal swab, presence of viral mutation(s) within the areas targeted by this assay, and inadequate number of viral copies(<138 copies/mL). A negative result must be combined with clinical observations, patient history, and epidemiological information. The expected result  is Negative.  Fact Sheet for Patients:  BloggerCourse.comhttps://www.fda.gov/media/152166/download  Fact Sheet for Healthcare Providers:  SeriousBroker.ithttps://www.fda.gov/media/152162/download  This test is no t yet approved or cleared by the [place] FDA and  has been authorized for detection and/or diagnosis of SARS-CoV-2 by FDA under an Emergency Use Authorization (EUA). This EUA will remain  in effect (meaning this test can be used) for the duration of the COVID-19 declaration under Section 564(b)(1) of the Act, 21 U.S.C.section 360bbb-3(b)(1), unless the authorization is terminated  or revoked sooner.       Influenza A by PCR NEGATIVE NEGATIVE Final   Influenza B by PCR NEGATIVE NEGATIVE Final    Comment: (NOTE) The Xpert Xpress  SARS-CoV-2/FLU/RSV plus assay is intended as an aid in the diagnosis of influenza from Nasopharyngeal swab specimens and should not be used as a sole basis for treatment. Nasal washings and aspirates are unacceptable for Xpert Xpress SARS-CoV-2/FLU/RSV testing.  Fact Sheet for Patients: BloggerCourse.comhttps://www.fda.gov/media/152166/download  Fact Sheet for Healthcare Providers: SeriousBroker.ithttps://www.fda.gov/media/152162/download  This test is not yet approved or cleared by the [place] FDA and has been authorized for detection and/or diagnosis of SARS-CoV-2 by FDA under an Emergency Use Authorization (EUA). This EUA will remain in effect (meaning this test can be used) for the duration of the COVID-19 declaration under Section 564(b)(1) of the Act, 21 U.S.C. section 360bbb-3(b)(1), unless the authorization is terminated or revoked.  Performed at [facility], [address] [place], [place] [address]      Radiological Exams on Admission: DG Chest 1 View  Result Date: 02/19/2020 CLINICAL DATA:  80 year old female status post unwitnessed fall. Left femur fracture. EXAM: CHEST  1 VIEW COMPARISON:  None. FINDINGS: Portable AP semi upright view at 0356 hours. Normal lung volumes. Cardiac size at the upper limits of normal. Other mediastinal contours are within normal limits. Visualized tracheal air column is within normal limits. No pneumothorax, pulmonary edema or consolidation. No definite pleural effusion or pulmonary edema. Streaky left lung base opacity near the cardiac apex might be atelectasis or scarring. Paucity of bowel gas in the upper abdomen. No acute osseous abnormality identified. IMPRESSION: 1. Small area of opacity at the left lung base is nonspecific. Atelectasis or scarring favored over infection or aspiration. 2. No other acute cardiopulmonary abnormality. Electronically Signed   By: Odessa FlemingH  Hall M.D.   On: 02/19/2020 04:33   CT Head Wo Contrast  Result Date: 02/19/2020 CLINICAL  DATA:  Unwitnessed fall. EXAM: CT HEAD WITHOUT CONTRAST CT CERVICAL SPINE WITHOUT CONTRAST TECHNIQUE: Multidetector CT imaging of the head and cervical spine was performed following the standard protocol without intravenous contrast. Multiplanar CT image reconstructions of the cervical spine were also generated. COMPARISON:  None. FINDINGS: CT HEAD FINDINGS Brain: Cerebral ventricle sizes are concordant with the degree of cerebral volume loss. Chronic right occipital lobe infarction. Patchy and confluent areas of decreased attenuation are noted throughout the deep and periventricular white matter of the cerebral hemispheres bilaterally, compatible with chronic microvascular ischemic disease. No evidence of large-territorial acute infarction. No parenchymal hemorrhage. No mass lesion. No extra-axial collection. No mass effect or midline shift. No hydrocephalus. Basilar cisterns are patent. Vascular: No hyperdense vessel. Atherosclerotic calcifications are present within the cavernous internal carotid and vertebral arteries. Skull: No acute fracture or focal lesion. Sinuses/Orbits: Paranasal sinuses and mastoid air cells are clear. Bilateral lens replacement. Otherwise the orbits are unremarkable. Other: None. CT CERVICAL SPINE FINDINGS Alignment: Normal. Skull base and vertebrae: Multilevel degenerative changes of the spine most  prominent at the C5-C6 level. No acute fracture. No aggressive appearing focal osseous lesion or focal pathologic process. Soft tissues and spinal canal: No prevertebral fluid or swelling. No visible canal hematoma. Upper chest: Unremarkable. Other: A 1.7 cm peripherally calcified hypodense nodule within the left thyroid gland. Atherosclerotic plaque of the carotid arteries within the neck. Atherosclerotic plaque of the aortic arch. IMPRESSION: 1. No acute intracranial abnormality. 2. No acute displaced fracture or traumatic listhesis of the cervical spine. 3. A 1.7 cm peripherally calcified  hypodense nodule within the left thyroid gland. Recommend thyroid US (ref: J Am Coll Radiol. 2015 Feb;12(2): 143-50). In the setting of significant comorbidities or limited life expectancy, no follow-up recommended (ref: J Am Coll Radiol. 2015 Feb;12(2): 143-50). 4.  Aortic Atherosclerosis (ICD10-I70.0). Electronically Signed   By: Tish Frederickson M.D.   On: 02/19/2020 05:11   CT Cervical Spine Wo Contrast  Result Date: 02/19/2020 CLINICAL DATA:  Unwitnessed fall. EXAM: CT HEAD WITHOUT CONTRAST CT CERVICAL SPINE WITHOUT CONTRAST TECHNIQUE: Multidetector CT imaging of the head and cervical spine was performed following the standard protocol without intravenous contrast. Multiplanar CT image reconstructions of the cervical spine were also generated. COMPARISON:  None. FINDINGS: CT HEAD FINDINGS Brain: Cerebral ventricle sizes are concordant with the degree of cerebral volume loss. Chronic right occipital lobe infarction. Patchy and confluent areas of decreased attenuation are noted throughout the deep and periventricular white matter of the cerebral hemispheres bilaterally, compatible with chronic microvascular ischemic disease. No evidence of large-territorial acute infarction. No parenchymal hemorrhage. No mass lesion. No extra-axial collection. No mass effect or midline shift. No hydrocephalus. Basilar cisterns are patent. Vascular: No hyperdense vessel. Atherosclerotic calcifications are present within the cavernous internal carotid and vertebral arteries. Skull: No acute fracture or focal lesion. Sinuses/Orbits: Paranasal sinuses and mastoid air cells are clear. Bilateral lens replacement. Otherwise the orbits are unremarkable. Other: None. CT CERVICAL SPINE FINDINGS Alignment: Normal. Skull base and vertebrae: Multilevel degenerative changes of the spine most prominent at the C5-C6 level. No acute fracture. No aggressive appearing focal osseous lesion or focal pathologic process. Soft tissues and spinal  canal: No prevertebral fluid or swelling. No visible canal hematoma. Upper chest: Unremarkable. Other: A 1.7 cm peripherally calcified hypodense nodule within the left thyroid gland. Atherosclerotic plaque of the carotid arteries within the neck. Atherosclerotic plaque of the aortic arch. IMPRESSION: 1. No acute intracranial abnormality. 2. No acute displaced fracture or traumatic listhesis of the cervical spine. 3. A 1.7 cm peripherally calcified hypodense nodule within the left thyroid gland. Recommend thyroid US (ref: J Am Coll Radiol. 2015 Feb;12(2): 143-50). In the setting of significant comorbidities or limited life expectancy, no follow-up recommended (ref: J Am Coll Radiol. 2015 Feb;12(2): 143-50). 4.  Aortic Atherosclerosis (ICD10-I70.0). Electronically Signed   By: Tish Frederickson M.D.   On: 02/19/2020 05:11   DG HIP UNILAT WITH PELVIS 2-3 VIEWS LEFT  Result Date: 02/19/2020 CLINICAL DATA:  80 year old female status post unwitnessed fall. Left hip pain. EXAM: DG HIP (WITH OR WITHOUT PELVIS) 2-3V LEFT COMPARISON:  None. FINDINGS: Extensive aortoiliac and proximal right femoral artery vascular calcifications. Femoral heads remain normally located. The pelvis appears intact. Negative visible bowel gas pattern. Grossly intact proximal right femur. Comminuted left intertrochanteric fracture with medial displacement 1/2 shaft width, over riding of 2-3 cm, anterior displacement 1/2 shaft width and mild posterior angulation. IMPRESSION: Comminuted left intertrochanteric fracture with displacement, overriding of fragments. Electronically Signed   By: Odessa Fleming M.D.   On: 02/19/2020  04:32     EKG: I have personally reviewed.  Atrial fibrillation, mild ST depression in inferior leads and V5-V6  Assessment/Plan Principal Problem:   Closed left hip fracture (HCC) Active Problems:   Diabetes mellitus without complication (HCC)   Normocytic anemia   HTN (hypertension)   Stroke (HCC)   Chronic a-fib  (HCC)   Chronic diastolic (congestive) heart failure (HCC)   Fall   Thyroid nodule   Closed left hip fracture: As evidenced by x-ray. Patient has severe pain now. No neurovascular compromise. Orthopedic surgeon, Dr. Allena Katz was consulted -->planning to do surgery in AM  - will admit to Med-surg bed - Pain control: dilaudid prn and percocet - When necessary Zofran for nausea - Robaxin for muscle spasm - type and cross - INR/PTT  Fall:  - PT/OT when able to (not ordered now)  Diabetes mellitus without complication (HCC): No A1c on record.  Patient taking Metformin.  Patient daughter states that patient was very sensitive to insulin, easily develop hypoglycemia with insulin treatment -very sensitive SSI  Addendum: Patient's daughter does not want patient to be treated with insulin. -D/C sliding scale insulin -Continue home Metformin  Normocytic anemia: Hemoglobin slightly dropped.  10.5 on 01/20/20 --> 9.3, no bleeding currently -Follow-up for back CBC  HTN (hypertension) -IV hydralazine as needed -Lotensin  Stroke (HCC) -Lipitor  Chronic a-fib (HCC): Stopped taking Xarelto due to GI bleeding.  Heart rate is 74. -Telemetry monitor  Chronic diastolic (congestive) heart failure (HCC): 2D echo 11/08/19 showed EF> 50%.  Patient does not have leg edema or JVD.  CHF seem to be compensated. -Check BMP  Thyroid nodule: CT of the C-spine incidentally showed thyroid nodule -Follow-up with PCP      DVT ppx: SCD Code Status: DNR Family Communication:  Yes, patient's daughter by phone Disposition Plan:  Anticipate discharge back to previous environment Consults called:  Dr. Allena Katz of ortho and Dr. Lady Gary of card are consulted. Admission status and Level of care: Med-Surg:   as inpt      Status is: Inpatient  Remains inpatient appropriate because:Inpatient level of care appropriate due to severity of illness   Dispo: The patient is from: SNF              Anticipated d/c is to:  SNF              Anticipated d/c date is: 2 days              Patient currently is not medically stable to d/c.   Difficult to place patient No           Date of Service 02/19/2020    Lorretta Harp [facility]   If 7PM-7AM, please contact night-coverage [email or web address] 02/19/2020, 6:06 PM

## 2020-02-19 NOTE — ED Notes (Signed)
Patient's daughter called to speak with RN. Patient has extensive medical history, usually seen at [facility]. Dementia at baseline.  DNR form bedside, patient does not have PoA at this time.

## 2020-02-19 NOTE — Consult Note (Signed)
Cardiology Consultation Note    Patient ID: Virginia Mann, MRN: [id number], DOB/AGE: 1940-05-31 80 y.o. Admit date: 02/19/2020   Date of Consult: 02/19/2020 Primary Physician: Lynwood Dawley, MD Primary Cardiologist:    Chief Complaint: hip pain Reason for Consultation: preop Requesting MD: Dr. Clyde Lundborg  HPI: Virginia Mann is a 80 y.o. female with history of chronic A. fib, chronic diastolic heart failure, diabetes, hypertension with presentation emergency room after a mechanical fall causing a left femoral fracture. Being seen with regard to risk stratification from a cardiac standpoint prior to surgery. She was evaluated at [facility] in November 2021 with an echocardiogram showing preserved LV function with mild mitral stenosis. No aortic stenosis. Mild TR. Mild MR. She has a history chart of vascular dementia, history of DVT and atrial fibrillation but apparently has been off anticoagulation due to GI bleed. EKG reveals atrial fibrillation with controlled ventricular response. Patient denies chest pain. Chest x-ray reveals atelectasis no pulmonary edema. X-ray reveals comminuted left intertrochanteric fracture with displacement and overriding of fragments. Brain CT reveals no acute intracranial abnormality. Cervical spine films reveals no acute displaced fracture. BNP is 1080. No evidence of heart failure on chest x-ray. Renal function is normal. She is being evaluated for urgent orthopedic surgery.  Past Medical History:  Diagnosis Date  . Chronic a-fib (HCC)   . Chronic diastolic (congestive) heart failure (HCC)   . Diabetes mellitus without complication (HCC)   . DVT (deep venous thrombosis) (HCC)   . HTN (hypertension)   . Renal disorder   . Stroke [facility])       Surgical History: History reviewed. No pertinent surgical history.   Home Meds: Prior to Admission medications   Not on File    Inpatient Medications:  . acetaminophen  1,000 mg Oral Once   . insulin aspart  0-5 Units Subcutaneous QHS  . insulin aspart  0-9 Units Subcutaneous TID WC   . tranexamic acid      Allergies: Not on File  Social History   Socioeconomic History  . Marital status: Married    Spouse name: Not on file  . Number of children: Not on file  . Years of education: Not on file  . Highest education level: Not on file  Occupational History  . Not on file  Tobacco Use  . Smoking status: Former Smoker    Types: Cigarettes  . Smokeless tobacco: Never Used  Vaping Use  . Vaping Use: Never used  Substance and Sexual Activity  . Alcohol use: Not Currently  . Drug use: Not Currently  . Sexual activity: Not Currently  Other Topics Concern  . Not on file  Social History Narrative  . Not on file   Social Determinants of Health   Financial Resource Strain: Not on file  Food Insecurity: Not on file  Transportation Needs: Not on file  Physical Activity: Not on file  Stress: Not on file  Social Connections: Not on file  Intimate Partner Violence: Not on file     History reviewed. No pertinent family history.   Review of Systems: A 12-system review of systems was performed and is negative except as noted in the HPI.  Labs: No results for input(s): CKTOTAL, CKMB, TROPONINI in the last 72 hours. Lab Results  Component Value Date   WBC 6.8 02/19/2020   HGB 9.3 (L) 02/19/2020   HCT 31.4 (L) 02/19/2020   MCV 88.2 02/19/2020   PLT 290 02/19/2020  Recent Labs  Lab 02/19/20 0355  NA 141  K 3.7  CL 106  CO2 23  BUN 16  CREATININE 0.96  CALCIUM 8.7*  PROT 6.6  BILITOT 0.5  ALKPHOS 87  ALT 11  AST 17  GLUCOSE 160*   No results found for: CHOL, HDL, LDLCALC, TRIG No results found for: DDIMER  Radiology/Studies:  DG Chest 1 View  Result Date: 02/19/2020 CLINICAL DATA:  80 year old female status post unwitnessed fall. Left femur fracture. EXAM: CHEST  1 VIEW COMPARISON:  None. FINDINGS: Portable AP semi upright view at 0356 hours.  Normal lung volumes. Cardiac size at the upper limits of normal. Other mediastinal contours are within normal limits. Visualized tracheal air column is within normal limits. No pneumothorax, pulmonary edema or consolidation. No definite pleural effusion or pulmonary edema. Streaky left lung base opacity near the cardiac apex might be atelectasis or scarring. Paucity of bowel gas in the upper abdomen. No acute osseous abnormality identified. IMPRESSION: 1. Small area of opacity at the left lung base is nonspecific. Atelectasis or scarring favored over infection or aspiration. 2. No other acute cardiopulmonary abnormality. Electronically Signed   By: Odessa Fleming M.D.   On: 02/19/2020 04:33   CT Head Wo Contrast  Result Date: 02/19/2020 CLINICAL DATA:  Unwitnessed fall. EXAM: CT HEAD WITHOUT CONTRAST CT CERVICAL SPINE WITHOUT CONTRAST TECHNIQUE: Multidetector CT imaging of the head and cervical spine was performed following the standard protocol without intravenous contrast. Multiplanar CT image reconstructions of the cervical spine were also generated. COMPARISON:  None. FINDINGS: CT HEAD FINDINGS Brain: Cerebral ventricle sizes are concordant with the degree of cerebral volume loss. Chronic right occipital lobe infarction. Patchy and confluent areas of decreased attenuation are noted throughout the deep and periventricular white matter of the cerebral hemispheres bilaterally, compatible with chronic microvascular ischemic disease. No evidence of large-territorial acute infarction. No parenchymal hemorrhage. No mass lesion. No extra-axial collection. No mass effect or midline shift. No hydrocephalus. Basilar cisterns are patent. Vascular: No hyperdense vessel. Atherosclerotic calcifications are present within the cavernous internal carotid and vertebral arteries. Skull: No acute fracture or focal lesion. Sinuses/Orbits: Paranasal sinuses and mastoid air cells are clear. Bilateral lens replacement. Otherwise the orbits  are unremarkable. Other: None. CT CERVICAL SPINE FINDINGS Alignment: Normal. Skull base and vertebrae: Multilevel degenerative changes of the spine most prominent at the C5-C6 level. No acute fracture. No aggressive appearing focal osseous lesion or focal pathologic process. Soft tissues and spinal canal: No prevertebral fluid or swelling. No visible canal hematoma. Upper chest: Unremarkable. Other: A 1.7 cm peripherally calcified hypodense nodule within the left thyroid gland. Atherosclerotic plaque of the carotid arteries within the neck. Atherosclerotic plaque of the aortic arch. IMPRESSION: 1. No acute intracranial abnormality. 2. No acute displaced fracture or traumatic listhesis of the cervical spine. 3. A 1.7 cm peripherally calcified hypodense nodule within the left thyroid gland. Recommend thyroid US (ref: J Am Coll Radiol. 2015 Feb;12(2): 143-50). In the setting of significant comorbidities or limited life expectancy, no follow-up recommended (ref: J Am Coll Radiol. 2015 Feb;12(2): 143-50). 4.  Aortic Atherosclerosis (ICD10-I70.0). Electronically Signed   By: Tish Frederickson M.D.   On: 02/19/2020 05:11   CT Cervical Spine Wo Contrast  Result Date: 02/19/2020 CLINICAL DATA:  Unwitnessed fall. EXAM: CT HEAD WITHOUT CONTRAST CT CERVICAL SPINE WITHOUT CONTRAST TECHNIQUE: Multidetector CT imaging of the head and cervical spine was performed following the standard protocol without intravenous contrast. Multiplanar CT image reconstructions of the cervical spine  were also generated. COMPARISON:  None. FINDINGS: CT HEAD FINDINGS Brain: Cerebral ventricle sizes are concordant with the degree of cerebral volume loss. Chronic right occipital lobe infarction. Patchy and confluent areas of decreased attenuation are noted throughout the deep and periventricular white matter of the cerebral hemispheres bilaterally, compatible with chronic microvascular ischemic disease. No evidence of large-territorial acute  infarction. No parenchymal hemorrhage. No mass lesion. No extra-axial collection. No mass effect or midline shift. No hydrocephalus. Basilar cisterns are patent. Vascular: No hyperdense vessel. Atherosclerotic calcifications are present within the cavernous internal carotid and vertebral arteries. Skull: No acute fracture or focal lesion. Sinuses/Orbits: Paranasal sinuses and mastoid air cells are clear. Bilateral lens replacement. Otherwise the orbits are unremarkable. Other: None. CT CERVICAL SPINE FINDINGS Alignment: Normal. Skull base and vertebrae: Multilevel degenerative changes of the spine most prominent at the C5-C6 level. No acute fracture. No aggressive appearing focal osseous lesion or focal pathologic process. Soft tissues and spinal canal: No prevertebral fluid or swelling. No visible canal hematoma. Upper chest: Unremarkable. Other: A 1.7 cm peripherally calcified hypodense nodule within the left thyroid gland. Atherosclerotic plaque of the carotid arteries within the neck. Atherosclerotic plaque of the aortic arch. IMPRESSION: 1. No acute intracranial abnormality. 2. No acute displaced fracture or traumatic listhesis of the cervical spine. 3. A 1.7 cm peripherally calcified hypodense nodule within the left thyroid gland. Recommend thyroid US (ref: J Am Coll Radiol. 2015 Feb;12(2): 143-50). In the setting of significant comorbidities or limited life expectancy, no follow-up recommended (ref: J Am Coll Radiol. 2015 Feb;12(2): 143-50). 4.  Aortic Atherosclerosis (ICD10-I70.0). Electronically Signed   By: Tish FredericksonMorgane  Naveau M.D.   On: 02/19/2020 05:11   DG HIP UNILAT WITH PELVIS 2-3 VIEWS LEFT  Result Date: 02/19/2020 CLINICAL DATA:  80 year old female status post unwitnessed fall. Left hip pain. EXAM: DG HIP (WITH OR WITHOUT PELVIS) 2-3V LEFT COMPARISON:  None. FINDINGS: Extensive aortoiliac and proximal right femoral artery vascular calcifications. Femoral heads remain normally located. The pelvis  appears intact. Negative visible bowel gas pattern. Grossly intact proximal right femur. Comminuted left intertrochanteric fracture with medial displacement 1/2 shaft width, over riding of 2-3 cm, anterior displacement 1/2 shaft width and mild posterior angulation. IMPRESSION: Comminuted left intertrochanteric fracture with displacement, overriding of fragments. Electronically Signed   By: Odessa FlemingH  Hall M.D.   On: 02/19/2020 04:32    Wt Readings from Last 3 Encounters:  02/19/20 64.4 kg    EKG: Atrial fibrillation with controlled ventricular response  Physical Exam: Elderly female complaining of hip pain on the left Blood pressure 132/61, pulse 74, temperature 97.8 F (36.6 C), temperature source Oral, resp. rate 13, height 5\' 1"  (1.549 m), weight 64.4 kg, SpO2 99 %. Body mass index is 26.83 kg/m. General: Well developed, well nourished, in no acute distress. Head: Normocephalic, atraumatic, sclera non-icteric, no xanthomas, nares are without discharge.  Neck: Negative for carotid bruits. JVD not elevated. Lungs: Clear bilaterally to auscultation without wheezes, rales, or rhonchi. Breathing is unlabored. Heart: Irregular regular rhythm Abdomen: Soft, non-tender, non-distended with normoactive bowel sounds. No hepatomegaly. No rebound/guarding. No obvious abdominal masses. Msk:  Strength and tone appear normal for age. Extremities: No clubbing or cyanosis. No edema.  Distal pedal pulses are 2+ and equal bilaterally. Neuro: Alert and oriented X 3. No facial asymmetry. No focal deficit. Moves all extremities spontaneously. Psych:  Responds to questions appropriately with a normal affect.     Assessment and Plan  80 year old female with history of chronic A. fib  history of DVT previously on anticoagulation but not currently due to GI bleed who presented after mechanical fall causing left trochanteric fracture. She had an echocardiogram done in November of last year showing preserved LV function  with mild mitral stenosis no left ear mild MR and TR. EKG shows atrial fibrillation with controlled rate with no ischemia. She denies chest pain. She appears to be optimized from a cardiac standpoint for surgery. She is at moderately increased risk due to age and lack of sinus rhythm but appears optimized from a cardiac standpoint. Would remain off anticoagulation and proceed with surgery as planned with no further cardiac work-up. Again patient is optimized from a cardiac standpoint.  Signed, Dalia Heading MD 02/19/2020, 9:33 AM Pager: [phone]

## 2020-02-20 ENCOUNTER — Inpatient Hospital Stay: Payer: Medicare Other | Admitting: Anesthesiology

## 2020-02-20 ENCOUNTER — Inpatient Hospital Stay: Payer: Medicare Other

## 2020-02-20 ENCOUNTER — Encounter
Admission: EM | Disposition: A | Discharge status: Skilled Nursing Facility | Payer: Self-pay | Source: Skilled Nursing Facility | Attending: Internal Medicine

## 2020-02-20 DIAGNOSIS — S72002A Fracture of unspecified part of neck of left femur, initial encounter for closed fracture: Secondary | ICD-10-CM | POA: Diagnosis not present

## 2020-02-20 DIAGNOSIS — I5032 Chronic diastolic (congestive) heart failure: Secondary | ICD-10-CM | POA: Diagnosis not present

## 2020-02-20 DIAGNOSIS — F0390 Unspecified dementia without behavioral disturbance: Secondary | ICD-10-CM | POA: Diagnosis not present

## 2020-02-20 DIAGNOSIS — I482 Chronic atrial fibrillation, unspecified: Secondary | ICD-10-CM | POA: Diagnosis not present

## 2020-02-20 HISTORY — PX: INTRAMEDULLARY (IM) NAIL INTERTROCHANTERIC: SHX5875

## 2020-02-20 LAB — BASIC METABOLIC PANEL
Anion gap: 12 (ref 5–15)
BUN: 14 mg/dL (ref 8–23)
CO2: 23 mmol/L (ref 22–32)
Calcium: 8.6 mg/dL — ABNORMAL LOW (ref 8.9–10.3)
Chloride: 104 mmol/L (ref 98–111)
Creatinine, Ser: 0.82 mg/dL (ref 0.44–1.00)
GFR, Estimated: >60 mL/min (ref >60)
Glucose, Bld: 138 mg/dL — ABNORMAL HIGH (ref 70–99)
Potassium: 4.1 mmol/L (ref 3.5–5.1)
Sodium: 139 mmol/L (ref 135–145)

## 2020-02-20 LAB — GLUCOSE, CAPILLARY
Glucose-Capillary: 145 mg/dL — ABNORMAL HIGH (ref 70–99)
Glucose-Capillary: 128 mg/dL — ABNORMAL HIGH (ref 70–99)
Glucose-Capillary: 155 mg/dL — ABNORMAL HIGH (ref 70–99)
Glucose-Capillary: 219 mg/dL — ABNORMAL HIGH (ref 70–99)
Glucose-Capillary: 154 mg/dL — ABNORMAL HIGH (ref 70–99)

## 2020-02-20 LAB — CBC
HCT: 29.1 % — ABNORMAL LOW (ref 36.0–46.0)
Hemoglobin: 9 g/dL — ABNORMAL LOW (ref 12.0–15.0)
MCH: 26.8 pg (ref 26.0–34.0)
MCHC: 30.9 g/dL (ref 30.0–36.0)
MCV: 86.6 fL (ref 80.0–100.0)
Platelets: 275 10*3/uL (ref 150–400)
RBC: 3.36 MIL/uL — ABNORMAL LOW (ref 3.87–5.11)
RDW: 18.2 % — ABNORMAL HIGH (ref 11.5–15.5)
WBC: 7.2 10*3/uL (ref 4.0–10.5)
nRBC: 0 % (ref 0.0–0.2)

## 2020-02-20 LAB — HEMOGLOBIN A1C
Hgb A1c MFr Bld: 6.5 % — ABNORMAL HIGH (ref 4.8–5.6)
Mean Plasma Glucose: 139.85 mg/dL

## 2020-02-20 SURGERY — FIXATION, FRACTURE, INTERTROCHANTERIC, WITH INTRAMEDULLARY ROD
Anesthesia: Spinal | Laterality: Left

## 2020-02-20 MED ORDER — PROPOFOL 10 MG/ML IV BOLUS
INTRAVENOUS | Status: DC | PRN
  Administered 2020-02-20: 80 mg via INTRAVENOUS
  Administered 2020-02-20: 30 mg via INTRAVENOUS
  Administered 2020-02-20: 20 mg via INTRAVENOUS
  Administered 2020-02-20: 30 mg via INTRAVENOUS

## 2020-02-20 MED ORDER — SODIUM CHLORIDE 0.9 % IR SOLN
Status: DC | PRN
  Administered 2020-02-20: 1000 mL

## 2020-02-20 MED ORDER — HYDROMORPHONE HCL 1 MG/ML IJ SOLN
0.2500 mg | INTRAMUSCULAR | Status: DC | PRN
  Administered 2020-02-21: 0.5 mg via INTRAVENOUS
  Filled 2020-02-20: qty 1

## 2020-02-20 MED ORDER — FENTANYL CITRATE (PF) 100 MCG/2ML IJ SOLN: 25.0000 ug | INTRAMUSCULAR | Status: DC | PRN

## 2020-02-20 MED ORDER — ENOXAPARIN SODIUM 40 MG/0.4ML ~~LOC~~ SOLN
40.0000 mg | SUBCUTANEOUS | Status: DC
  Administered 2020-02-21: 40 mg via SUBCUTANEOUS
  Filled 2020-02-20: qty 0.4

## 2020-02-20 MED ORDER — OXYCODONE HCL 5 MG PO TABS
5.0000 mg | ORAL_TABLET | ORAL | Status: DC | PRN
  Administered 2020-02-21 – 2020-02-22 (×4): 10 mg via ORAL
  Filled 2020-02-20 (×6): qty 2

## 2020-02-20 MED ORDER — ACETAMINOPHEN 10 MG/ML IV SOLN
INTRAVENOUS | Status: AC
  Filled 2020-02-20: qty 100

## 2020-02-20 MED ORDER — ONDANSETRON HCL 4 MG/2ML IJ SOLN
INTRAMUSCULAR | Status: AC
  Filled 2020-02-20: qty 2

## 2020-02-20 MED ORDER — METOCLOPRAMIDE HCL 5 MG/ML IJ SOLN: 5.0000 mg | Freq: Three times a day (TID) | INTRAMUSCULAR | Status: DC | PRN

## 2020-02-20 MED ORDER — OXYCODONE HCL 5 MG PO TABS: 2.5000 mg | ORAL_TABLET | ORAL | Status: DC | PRN

## 2020-02-20 MED ORDER — BUPIVACAINE HCL (PF) 0.5 % IJ SOLN
INTRAMUSCULAR | Status: DC | PRN
  Administered 2020-02-20: 30 mL

## 2020-02-20 MED ORDER — SODIUM CHLORIDE 0.9 % IV SOLN: INTRAVENOUS | Status: DC

## 2020-02-20 MED ORDER — PROPOFOL 500 MG/50ML IV EMUL
INTRAVENOUS | Status: DC | PRN
  Administered 2020-02-20: 20 ug/kg/min via INTRAVENOUS

## 2020-02-20 MED ORDER — PHENYLEPHRINE HCL (PRESSORS) 10 MG/ML IV SOLN
INTRAVENOUS | Status: DC | PRN
  Administered 2020-02-20 (×2): 200 ug via INTRAVENOUS
  Administered 2020-02-20: 100 ug via INTRAVENOUS
  Administered 2020-02-20: 200 ug via INTRAVENOUS
  Administered 2020-02-20: 100 ug via INTRAVENOUS
  Administered 2020-02-20: 200 ug via INTRAVENOUS
  Administered 2020-02-20: 100 ug via INTRAVENOUS
  Administered 2020-02-20: 200 ug via INTRAVENOUS

## 2020-02-20 MED ORDER — ONDANSETRON HCL 4 MG/2ML IJ SOLN: 4.0000 mg | Freq: Four times a day (QID) | INTRAMUSCULAR | Status: DC | PRN

## 2020-02-20 MED ORDER — CEFAZOLIN SODIUM-DEXTROSE 1-4 GM/50ML-% IV SOLN
1.0000 g | Freq: Four times a day (QID) | INTRAVENOUS | Status: AC
Start: 2020-02-20 — End: 2020-02-21
  Administered 2020-02-20 – 2020-02-21 (×3): 1 g via INTRAVENOUS
  Filled 2020-02-20 (×3): qty 50

## 2020-02-20 MED ORDER — ONDANSETRON HCL 4 MG/2ML IJ SOLN: 4.0000 mg | Freq: Once | INTRAMUSCULAR | Status: DC | PRN

## 2020-02-20 MED ORDER — ROCURONIUM BROMIDE 10 MG/ML (PF) SYRINGE
PREFILLED_SYRINGE | INTRAVENOUS | Status: AC
  Filled 2020-02-20: qty 10

## 2020-02-20 MED ORDER — ROCURONIUM BROMIDE 100 MG/10ML IV SOLN
INTRAVENOUS | Status: DC | PRN
  Administered 2020-02-20: 10 mg via INTRAVENOUS
  Administered 2020-02-20: 50 mg via INTRAVENOUS

## 2020-02-20 MED ORDER — FENTANYL CITRATE (PF) 100 MCG/2ML IJ SOLN
INTRAMUSCULAR | Status: AC
  Filled 2020-02-20: qty 2

## 2020-02-20 MED ORDER — BUPIVACAINE LIPOSOME 1.3 % IJ SUSP
INTRAMUSCULAR | Status: DC | PRN
  Administered 2020-02-20: 20 mL

## 2020-02-20 MED ORDER — SENNOSIDES-DOCUSATE SODIUM 8.6-50 MG PO TABS: 1.0000 | ORAL_TABLET | Freq: Every evening | ORAL | Status: DC | PRN

## 2020-02-20 MED ORDER — ACETAMINOPHEN 10 MG/ML IV SOLN
INTRAVENOUS | Status: DC | PRN
  Administered 2020-02-20: 1000 mg via INTRAVENOUS

## 2020-02-20 MED ORDER — ONDANSETRON HCL 4 MG/2ML IJ SOLN
INTRAMUSCULAR | Status: DC | PRN
  Administered 2020-02-20: 4 mg via INTRAVENOUS

## 2020-02-20 MED ORDER — DEXAMETHASONE SODIUM PHOSPHATE 10 MG/ML IJ SOLN
INTRAMUSCULAR | Status: AC
  Filled 2020-02-20: qty 1

## 2020-02-20 MED ORDER — KETOROLAC TROMETHAMINE 15 MG/ML IJ SOLN
7.5000 mg | Freq: Four times a day (QID) | INTRAMUSCULAR | Status: AC
  Administered 2020-02-20 – 2020-02-21 (×4): 7.5 mg via INTRAVENOUS
  Filled 2020-02-20 (×4): qty 1

## 2020-02-20 MED ORDER — SUGAMMADEX SODIUM 200 MG/2ML IV SOLN
INTRAVENOUS | Status: DC | PRN
  Administered 2020-02-20: 200 mg via INTRAVENOUS

## 2020-02-20 MED ORDER — LIDOCAINE HCL (PF) 2 % IJ SOLN
INTRAMUSCULAR | Status: DC | PRN
  Administered 2020-02-20 (×3): 40 mg via INTRADERMAL

## 2020-02-20 MED ORDER — ACETAMINOPHEN 500 MG PO TABS
1000.0000 mg | ORAL_TABLET | Freq: Three times a day (TID) | ORAL | Status: DC
  Administered 2020-02-20 – 2020-02-23 (×9): 1000 mg via ORAL
  Filled 2020-02-20 (×10): qty 2

## 2020-02-20 MED ORDER — FLEET ENEMA 7-19 GM/118ML RE ENEM: 1.0000 | ENEMA | Freq: Once | RECTAL | Status: DC | PRN

## 2020-02-20 MED ORDER — TRAMADOL HCL 50 MG PO TABS
50.0000 mg | ORAL_TABLET | Freq: Four times a day (QID) | ORAL | Status: DC | PRN
  Administered 2020-02-22 – 2020-02-23 (×3): 50 mg via ORAL
  Filled 2020-02-20 (×3): qty 1

## 2020-02-20 MED ORDER — LIDOCAINE HCL (PF) 2 % IJ SOLN
INTRAMUSCULAR | Status: AC
  Filled 2020-02-20: qty 5

## 2020-02-20 MED ORDER — BISACODYL 10 MG RE SUPP: 10.0000 mg | Freq: Every day | RECTAL | Status: DC | PRN

## 2020-02-20 MED ORDER — ONDANSETRON HCL 4 MG PO TABS
4.0000 mg | ORAL_TABLET | Freq: Four times a day (QID) | ORAL | Status: DC | PRN
  Administered 2020-02-21: 4 mg via ORAL
  Filled 2020-02-20: qty 1

## 2020-02-20 MED ORDER — SODIUM CHLORIDE 0.9 % IV SOLN
INTRAVENOUS | Status: DC | PRN
  Administered 2020-02-20: 1000 mL via INTRAVENOUS

## 2020-02-20 MED ORDER — DOCUSATE SODIUM 100 MG PO CAPS
100.0000 mg | ORAL_CAPSULE | Freq: Two times a day (BID) | ORAL | Status: DC
  Administered 2020-02-20 – 2020-02-23 (×6): 100 mg via ORAL
  Filled 2020-02-20 (×6): qty 1

## 2020-02-20 MED ORDER — FENTANYL CITRATE (PF) 100 MCG/2ML IJ SOLN
INTRAMUSCULAR | Status: DC | PRN
  Administered 2020-02-20 (×4): 25 ug via INTRAVENOUS

## 2020-02-20 MED ORDER — PROPOFOL 10 MG/ML IV BOLUS
INTRAVENOUS | Status: AC
  Filled 2020-02-20: qty 20

## 2020-02-20 MED ORDER — EPHEDRINE SULFATE 50 MG/ML IJ SOLN
INTRAMUSCULAR | Status: DC | PRN
  Administered 2020-02-20 (×3): 5 mg via INTRAVENOUS

## 2020-02-20 MED ORDER — CEFAZOLIN SODIUM 1 G IJ SOLR
INTRAMUSCULAR | Status: AC
  Filled 2020-02-20: qty 20

## 2020-02-20 MED ORDER — CEFAZOLIN SODIUM-DEXTROSE 1-4 GM/50ML-% IV SOLN
INTRAVENOUS | Status: DC | PRN
  Administered 2020-02-20: 2 g via INTRAVENOUS

## 2020-02-20 MED ORDER — ALBUTEROL SULFATE HFA 108 (90 BASE) MCG/ACT IN AERS
INHALATION_SPRAY | RESPIRATORY_TRACT | Status: AC
  Filled 2020-02-20: qty 6.7

## 2020-02-20 MED ORDER — METOCLOPRAMIDE HCL 10 MG PO TABS: 5.0000 mg | ORAL_TABLET | Freq: Three times a day (TID) | ORAL | Status: DC | PRN

## 2020-02-20 MED ORDER — DEXAMETHASONE SODIUM PHOSPHATE 10 MG/ML IJ SOLN
INTRAMUSCULAR | Status: DC | PRN
  Administered 2020-02-20: 10 mg via INTRAVENOUS

## 2020-02-20 SURGICAL SUPPLY — 57 items
"PENCIL ELECTRO HAND CTR " (MISCELLANEOUS) ×1 IMPLANT
BIT DRILL INTERTAN LAG SCREW (BIT) ×1 IMPLANT
BIT DRILL SHORT 4.0 (BIT) IMPLANT
BLADE SURG 15 STRL LF DISP TIS (BLADE) ×1 IMPLANT
BLADE SURG 15 STRL SS (BLADE) ×1
CANISTER SUCT 1200ML W/VALVE (MISCELLANEOUS) ×2 IMPLANT
CHLORAPREP W/TINT 26 (MISCELLANEOUS) ×2 IMPLANT
COVER WAND RF STERILE (DRAPES) ×2 IMPLANT
DRAPE 3/4 80X56 (DRAPES) ×2 IMPLANT
DRAPE STERI IOBAN 125X83 (DRAPES) ×1 IMPLANT
DRAPE SURG 17X11 SM STRL (DRAPES) ×4 IMPLANT
DRAPE U-SHAPE 47X51 STRL (DRAPES) ×3 IMPLANT
DRILL BIT SHORT 4.0 (BIT) ×2
DRSG AQUACEL AG 3.5X4 (GAUZE/BANDAGES/DRESSINGS) ×2 IMPLANT
DRSG OPSITE POSTOP 4X6 (GAUZE/BANDAGES/DRESSINGS) ×1 IMPLANT
ELECT BLADE 4.0 EZ CLEAN MEGAD (MISCELLANEOUS) ×2
ELECT REM PT RETURN 9FT ADLT (ELECTROSURGICAL) ×2
ELECTRODE BLDE 4.0 EZ CLN MEGD (MISCELLANEOUS) IMPLANT
ELECTRODE REM PT RTRN 9FT ADLT (ELECTROSURGICAL) ×1 IMPLANT
GAUZE XEROFORM 1X8 LF (GAUZE/BANDAGES/DRESSINGS) ×1 IMPLANT
GLOVE SRG 8 PF TXTR STRL LF DI (GLOVE) ×1 IMPLANT
GLOVE SURG SYN 7.5  E (GLOVE) ×1
GLOVE SURG SYN 7.5 E (GLOVE) ×1 IMPLANT
GLOVE SURG SYN 7.5 PF PI (GLOVE) ×1 IMPLANT
GLOVE SURG UNDER POLY LF SZ8 (GLOVE) ×1
GOWN STRL REUS W/ TWL LRG LVL3 (GOWN DISPOSABLE) ×1 IMPLANT
GOWN STRL REUS W/ TWL XL LVL3 (GOWN DISPOSABLE) ×1 IMPLANT
GOWN STRL REUS W/TWL LRG LVL3 (GOWN DISPOSABLE) ×1
GOWN STRL REUS W/TWL XL LVL3 (GOWN DISPOSABLE) ×1
GUIDE PIN 3.2X343 (PIN) ×2
GUIDE PIN 3.2X343MM (PIN) ×2
GUIDE ROD 3.0 (MISCELLANEOUS) ×2
KIT PATIENT CARE HANA TABLE (KITS) ×2 IMPLANT
KIT TURNOVER KIT A (KITS) ×2 IMPLANT
MANIFOLD NEPTUNE II (INSTRUMENTS) ×2 IMPLANT
MAT ABSORB  FLUID 56X50 GRAY (MISCELLANEOUS) ×1
MAT ABSORB FLUID 56X50 GRAY (MISCELLANEOUS) ×2 IMPLANT
NAIL LOCK CANN 10X380 130D LT (Nail) ×1 IMPLANT
NDL FILTER BLUNT 18X1 1/2 (NEEDLE) ×1 IMPLANT
NEEDLE FILTER BLUNT 18X 1/2SAF (NEEDLE) ×1
NEEDLE FILTER BLUNT 18X1 1/2 (NEEDLE) ×1 IMPLANT
NEEDLE HYPO 22GX1.5 SAFETY (NEEDLE) ×2 IMPLANT
NS IRRIG 1000ML POUR BTL (IV SOLUTION) ×2 IMPLANT
PACK HIP COMPR (MISCELLANEOUS) ×2 IMPLANT
PENCIL ELECTRO HAND CTR (MISCELLANEOUS) ×2 IMPLANT
PIN GUIDE 3.2X343MM (PIN) IMPLANT
ROD GUIDE 3.0 (MISCELLANEOUS) IMPLANT
SCREW LAG COMPR KIT 95/90 (Screw) ×1 IMPLANT
SCREW TRIGEN LOW PROF 5.0X42.5 (Screw) ×1 IMPLANT
SCREW TRIGEN LOW PROF 5.0X45 (Screw) ×1 IMPLANT
STAPLER SKIN PROX 35W (STAPLE) ×2 IMPLANT
SUT VIC AB 0 CT1 36 (SUTURE) ×1 IMPLANT
SUT VIC AB 2-0 CT2 27 (SUTURE) ×2 IMPLANT
SYR 10ML LL (SYRINGE) ×2 IMPLANT
SYR 30ML LL (SYRINGE) ×2 IMPLANT
SYR BULB IRRIG 60ML STRL (SYRINGE) ×1 IMPLANT
TAPE CLOTH 3X10 WHT NS LF (GAUZE/BANDAGES/DRESSINGS) ×3 IMPLANT

## 2020-02-20 NOTE — Progress Notes (Signed)
[facility]  - [facility]   PATIENT NAME: Virginia DarlingJoyce Weill    MR#:  [id number]  DATE OF BIRTH:  02/21/1940  SUBJECTIVE:  patient came in from [facility] after she had a mechanical fall. Sustained left hip fracture. She just got back from surgery seen in the room. She appears alert and awake.  No family in the room at present  REVIEW OF SYSTEMS:   Review of Systems  Constitutional: Negative for chills, fever and weight loss.  HENT: Negative for ear discharge, ear pain and nosebleeds.   Eyes: Negative for blurred vision, pain and discharge.  Respiratory: Negative for sputum production, shortness of breath, wheezing and stridor.   Cardiovascular: Negative for chest pain, palpitations, orthopnea and PND.  Gastrointestinal: Negative for abdominal pain, diarrhea, nausea and vomiting.  Genitourinary: Negative for frequency and urgency.  Musculoskeletal: Positive for back pain. Negative for joint pain.  Neurological: Negative for sensory change, speech change, focal weakness and weakness.  Psychiatric/Behavioral: Negative for depression and hallucinations. The patient is not nervous/anxious.    Tolerating Diet:yesTolerating PT: pending  DRUG ALLERGIES:   Allergies  Allergen Reactions  . Apixaban Itching and Rash    VITALS:  Blood pressure (!) 173/71, pulse 74, temperature 97.9 F (36.6 C), resp. rate 17, height 5\' 1"  (1.549 m), weight 64.4 kg, SpO2 97 %.  PHYSICAL EXAMINATION:   Physical Exam  GENERAL:  80 y.o.-year-old patient lying in the bed with no acute distress. fraile LUNGS: Normal breath sounds bilaterally, no wheezing, rales, rhonchi. No use of accessory muscles of respiration.  CARDIOVASCULAR: S1, S2 normal. No murmurs, rubs, or gallops.  ABDOMEN: Soft, nontender, nondistended. Bowel sounds present. No organomegaly or mass.  EXTREMITIES: No cyanosis, clubbing or edema b/l.   Surgical dressing left hip NEUROLOGIC: grossly  nonfocal. PSYCHIATRIC:  patient is alert and awake SKIN: No obvious rash, lesion, or ulcer.   LABORATORY PANEL:  CBC Recent Labs  Lab 02/20/20 0539  WBC 7.2  HGB 9.0*  HCT 29.1*  PLT 275    Chemistries  Recent Labs  Lab 02/19/20 0355 02/20/20 0539  NA 141 139  K 3.7 4.1  CL 106 104  CO2 23 23  GLUCOSE 160* 138*  BUN 16 14  CREATININE 0.96 0.82  CALCIUM 8.7* 8.6*  AST 17  --   ALT 11  --   ALKPHOS 87  --   BILITOT 0.5  --    Cardiac Enzymes No results for input(s): TROPONINI in the last 168 hours. RADIOLOGY:  DG Chest 1 View  Result Date: 02/19/2020 CLINICAL DATA:  80 year old female status post unwitnessed fall. Left femur fracture. EXAM: CHEST  1 VIEW COMPARISON:  None. FINDINGS: Portable AP semi upright view at 0356 hours. Normal lung volumes. Cardiac size at the upper limits of normal. Other mediastinal contours are within normal limits. Visualized tracheal air column is within normal limits. No pneumothorax, pulmonary edema or consolidation. No definite pleural effusion or pulmonary edema. Streaky left lung base opacity near the cardiac apex might be atelectasis or scarring. Paucity of bowel gas in the upper abdomen. No acute osseous abnormality identified. IMPRESSION: 1. Small area of opacity at the left lung base is nonspecific. Atelectasis or scarring favored over infection or aspiration. 2. No other acute cardiopulmonary abnormality. Electronically Signed   By: Odessa FlemingH  Hall M.D.   On: 02/19/2020 04:33   CT Head Wo Contrast  Result Date: 02/19/2020 CLINICAL DATA:  Unwitnessed fall. EXAM: CT HEAD WITHOUT CONTRAST CT CERVICAL  SPINE WITHOUT CONTRAST TECHNIQUE: Multidetector CT imaging of the head and cervical spine was performed following the standard protocol without intravenous contrast. Multiplanar CT image reconstructions of the cervical spine were also generated. COMPARISON:  None. FINDINGS: CT HEAD FINDINGS Brain: Cerebral ventricle sizes are concordant with the degree  of cerebral volume loss. Chronic right occipital lobe infarction. Patchy and confluent areas of decreased attenuation are noted throughout the deep and periventricular white matter of the cerebral hemispheres bilaterally, compatible with chronic microvascular ischemic disease. No evidence of large-territorial acute infarction. No parenchymal hemorrhage. No mass lesion. No extra-axial collection. No mass effect or midline shift. No hydrocephalus. Basilar cisterns are patent. Vascular: No hyperdense vessel. Atherosclerotic calcifications are present within the cavernous internal carotid and vertebral arteries. Skull: No acute fracture or focal lesion. Sinuses/Orbits: Paranasal sinuses and mastoid air cells are clear. Bilateral lens replacement. Otherwise the orbits are unremarkable. Other: None. CT CERVICAL SPINE FINDINGS Alignment: Normal. Skull base and vertebrae: Multilevel degenerative changes of the spine most prominent at the C5-C6 level. No acute fracture. No aggressive appearing focal osseous lesion or focal pathologic process. Soft tissues and spinal canal: No prevertebral fluid or swelling. No visible canal hematoma. Upper chest: Unremarkable. Other: A 1.7 cm peripherally calcified hypodense nodule within the left thyroid gland. Atherosclerotic plaque of the carotid arteries within the neck. Atherosclerotic plaque of the aortic arch. IMPRESSION: 1. No acute intracranial abnormality. 2. No acute displaced fracture or traumatic listhesis of the cervical spine. 3. A 1.7 cm peripherally calcified hypodense nodule within the left thyroid gland. Recommend thyroid US (ref: J Am Coll Radiol. 2015 Feb;12(2): 143-50). In the setting of significant comorbidities or limited life expectancy, no follow-up recommended (ref: J Am Coll Radiol. 2015 Feb;12(2): 143-50). 4.  Aortic Atherosclerosis (ICD10-I70.0). Electronically Signed   By: Tish Frederickson M.D.   On: 02/19/2020 05:11   CT Cervical Spine Wo Contrast  Result  Date: 02/19/2020 CLINICAL DATA:  Unwitnessed fall. EXAM: CT HEAD WITHOUT CONTRAST CT CERVICAL SPINE WITHOUT CONTRAST TECHNIQUE: Multidetector CT imaging of the head and cervical spine was performed following the standard protocol without intravenous contrast. Multiplanar CT image reconstructions of the cervical spine were also generated. COMPARISON:  None. FINDINGS: CT HEAD FINDINGS Brain: Cerebral ventricle sizes are concordant with the degree of cerebral volume loss. Chronic right occipital lobe infarction. Patchy and confluent areas of decreased attenuation are noted throughout the deep and periventricular white matter of the cerebral hemispheres bilaterally, compatible with chronic microvascular ischemic disease. No evidence of large-territorial acute infarction. No parenchymal hemorrhage. No mass lesion. No extra-axial collection. No mass effect or midline shift. No hydrocephalus. Basilar cisterns are patent. Vascular: No hyperdense vessel. Atherosclerotic calcifications are present within the cavernous internal carotid and vertebral arteries. Skull: No acute fracture or focal lesion. Sinuses/Orbits: Paranasal sinuses and mastoid air cells are clear. Bilateral lens replacement. Otherwise the orbits are unremarkable. Other: None. CT CERVICAL SPINE FINDINGS Alignment: Normal. Skull base and vertebrae: Multilevel degenerative changes of the spine most prominent at the C5-C6 level. No acute fracture. No aggressive appearing focal osseous lesion or focal pathologic process. Soft tissues and spinal canal: No prevertebral fluid or swelling. No visible canal hematoma. Upper chest: Unremarkable. Other: A 1.7 cm peripherally calcified hypodense nodule within the left thyroid gland. Atherosclerotic plaque of the carotid arteries within the neck. Atherosclerotic plaque of the aortic arch. IMPRESSION: 1. No acute intracranial abnormality. 2. No acute displaced fracture or traumatic listhesis of the cervical spine. 3. A 1.7  cm peripherally calcified  hypodense nodule within the left thyroid gland. Recommend thyroid US (ref: J Am Coll Radiol. 2015 Feb;12(2): 143-50). In the setting of significant comorbidities or limited life expectancy, no follow-up recommended (ref: J Am Coll Radiol. 2015 Feb;12(2): 143-50). 4.  Aortic Atherosclerosis (ICD10-I70.0). Electronically Signed   By: Tish Frederickson M.D.   On: 02/19/2020 05:11   DG HIP OPERATIVE UNILAT W OR W/O PELVIS LEFT  Result Date: 02/20/2020 CLINICAL DATA:  Left hip ORIF. EXAM: OPERATIVE LEFT HIP (WITH PELVIS IF PERFORMED) TECHNIQUE: Fluoroscopic spot image(s) were submitted for interpretation post-operatively. FLUOROSCOPY TIME:  2 minutes, 57 seconds. C-arm fluoroscopic images were obtained intraoperatively and submitted for post operative interpretation. COMPARISON:  Left hip x-rays from yesterday. FINDINGS: Four intraoperative fluoroscopic images demonstrate interval cephalomedullary rod fixation of the left intertrochanteric femur fracture, now in anatomic alignment. IMPRESSION: Intraoperative fluoroscopic guidance for left intertrochanteric femur fracture ORIF. Electronically Signed   By: Obie Dredge M.D.   On: 02/20/2020 12:06   DG HIP UNILAT WITH PELVIS 2-3 VIEWS LEFT  Result Date: 02/19/2020 CLINICAL DATA:  80 year old female status post unwitnessed fall. Left hip pain. EXAM: DG HIP (WITH OR WITHOUT PELVIS) 2-3V LEFT COMPARISON:  None. FINDINGS: Extensive aortoiliac and proximal right femoral artery vascular calcifications. Femoral heads remain normally located. The pelvis appears intact. Negative visible bowel gas pattern. Grossly intact proximal right femur. Comminuted left intertrochanteric fracture with medial displacement 1/2 shaft width, over riding of 2-3 cm, anterior displacement 1/2 shaft width and mild posterior angulation. IMPRESSION: Comminuted left intertrochanteric fracture with displacement, overriding of fragments. Electronically Signed   By: Odessa Fleming  M.D.   On: 02/19/2020 04:32   ASSESSMENT AND PLAN:  Lylianna Fraiser is a 80 y.o. female with medical history significant of hypertension, hyperlipidemia, diabetes mellitus, stroke, GERD, depression, atrial fibrillation on anticoagulants, GI bleeding, vascular dementia, DVT, who presents with fall and left hip pain.  Closed left hip fracture: As evidenced by x-ray. Patient has severe pain now. No neurovascular compromise. -- Orthopedic surgeon, Dr. Allena Katz  consult appreciated -POD ")" left hip intramedullary nailing of left femur - Pain control: dilaudid prn and percocet - When necessary Zofran for nausea - Robaxin for muscle spasm  Fall:  - PT/OT from am  Diabetes mellitus without complication (HCC): No A1c on record.   --Patient taking Metformin.  Patient daughter states that patient was very sensitive to insulin, easily develop hypoglycemia with insulin treatment -no SSI per dter's request  Normocytic anemia: Hemoglobin slightly dropped.  10.5 on 01/20/20 --> 9.3, no bleeding currently  HTN (hypertension) -IV hydralazine as needed -Lotensin  Stroke (HCC) -Lipitor  Chronic a-fib (HCC): Stopped taking Xarelto due to GI bleeding.  Heart rate is 74. -Telemetry monitor --seen by Dr Lady Gary  Chronic diastolic (congestive) heart failure [facility]): 2D echo 11/08/19 showed EF> 50%.  Patient does not have leg edema or JVD.  CHF seem to be compensated.  Thyroid nodule: CT of the C-spine incidentally showed thyroid nodule -Follow-up with PCP  Nutrition Status: Nutrition Problem: Increased nutrient needs Etiology: post-op healing,hip fracture Signs/Symptoms: estimated needs Interventions: Premier Protein,MVI,Juven       DVT ppx: SCD Code Status: DNR prior to admission Family Communication:  Yes, patient's daughter Rayfield Citizen by phone Disposition Plan:  Anticipate discharge back to previous environment Consults called:  Dr. Allena Katz of ortho and Dr. Lady Gary of card are  consulted. Admission status and Level of care: Med-Surg:   as inpt      Status is: Inpatient  Remains inpatient  appropriate because:Inpatient level of care appropriate due to severity of illness   Dispo: The patient is from: SNF  Anticipated d/c is to: SNF  Anticipated d/c date is: 2 days  Patient currently is not medically stable to d/c.              Difficult to place patient No   Level of care: Med-Surg          TOTAL TIME TAKING CARE OF THIS PATIENT: *25* minutes.  >50% time spent on counselling and coordination of care  Note: This dictation was prepared with Dragon dictation along with smaller phrase technology. Any transcriptional errors that result from this process are unintentional.  Enedina Finner M.D    [facility]   CC: Primary care physician; Lynwood Dawley, MDPatient ID: [name], female   DOB: 10-06-1940, 80 y.o.   MRN: [id number]

## 2020-02-20 NOTE — Consult Note (Signed)
ORTHOPAEDIC CONSULTATION  REQUESTING PHYSICIAN: Enedina FinnerPatel, Sona, MD  Chief Complaint:   L hip pain  History of Present Illness: History obtained from chart review, discussion with other medical providers, and discussion with the patient and her daughter.  Virginia Mann is a 80 y.o. female with a past medical history of vascular dementia, diabetes, atrial fibrillation, prior CVA, existing DVT, complicated by frequent and recent GI bleed who had a fall at her skilled nursing facility yesterday morning.  The patient noted immediate hip pain and inability to ambulate.  The patient ambulates with a walker at baseline.  The patient had been living at home with supervision, but was at a skilled nursing facility after recent hospitalization at [facility] for GI bleed.  Pain is improved with rest and immobilization.  Pain is worse with any sort of movement.  X-rays in the emergency department show a left intertrochanteric hip fracture.  Of note, she was admitted to [facility] in December 2021 for GI bleed.  Decision was made at that point to discontinue anticoagulation for atrial fibrillation as well as existing DVT.  This DVT was noted to be stable and family was accepting of the risk of possible complication from no anticoagulation in the hopes of avoiding recurrent GI bleed.  She was also evaluated in the emergency department in January 2022 due to compression fracture.  She has been at a skilled nursing facility with anticipated discharge home this upcoming week when she had the above-mentioned fall.  She had been ambulatory unassisted until approximately 3 months ago, since which time she has been more deconditioned and had to use a walker for ambulation.  She also has vascular dementia where on some days she mentate appropriately and other days she has significant short-term memory loss.  Past Medical History:  Diagnosis Date  .  Chronic a-fib (HCC)   . Chronic diastolic (congestive) heart failure (HCC)   . Diabetes mellitus without complication (HCC)   . DVT (deep venous thrombosis) (HCC)   . HTN (hypertension)   . Renal disorder   . Stroke [facility](HCC)    History reviewed. No pertinent surgical history. Social History   Socioeconomic History  . Marital status: Married    Spouse name: Not on file  . Number of children: Not on file  . Years of education: Not on file  . Highest education level: Not on file  Occupational History  . Not on file  Tobacco Use  . Smoking status: Former Smoker    Types: Cigarettes  . Smokeless tobacco: Never Used  Vaping Use  . Vaping Use: Never used  Substance and Sexual Activity  . Alcohol use: Not Currently  . Drug use: Not Currently  . Sexual activity: Not Currently  Other Topics Concern  . Not on file  Social History Narrative  . Not on file   Social Determinants of Health   Financial Resource Strain: Not on file  Food Insecurity: Not on file  Transportation Needs: Not on file  Physical Activity: Not on file  Stress: Not on file  Social Connections: Not on file   Family History  Problem Relation Age of Onset  . CAD Father   . Hypertension Father    Allergies  Allergen Reactions  . Apixaban Itching and Rash   Prior to Admission medications   Medication Sig Start Date End Date Taking? Authorizing Provider  acetaminophen (TYLENOL) 500 MG tablet Take 500 mg by mouth every 6 (six) hours as needed.   Yes  [provider]  atorvastatin (LIPITOR) 40 MG tablet Take 40 mg by mouth at bedtime. 12/15/19  Yes [provider]  benazepril (LOTENSIN) 40 MG tablet Take 40 mg by mouth daily. 08/27/19  Yes [provider]  Cholecalciferol 25 MCG (1000 UT) capsule Take 2,000 Units by mouth daily. 08/24/19  Yes [provider]  cyanocobalamin 1000 MCG tablet Take 1,000 mcg by mouth daily. 08/24/19 08/23/20 Yes [provider]  donepezil  (ARICEPT) 10 MG tablet Take 10 mg by mouth at bedtime. 01/14/20  Yes [provider]  metFORMIN (GLUCOPHAGE-XR) 500 MG 24 hr tablet Take 500 mg by mouth daily with breakfast. 12/15/19  Yes [provider]  omeprazole (PRILOSEC) 40 MG capsule Take 40 mg by mouth daily before breakfast. 12/15/19  Yes [provider]  senna-docusate (SENOKOT-S) 8.6-50 MG tablet Take 1 tablet by mouth 2 (two) times daily.   Yes [provider]  sertraline (ZOLOFT) 50 MG tablet Take 50 mg by mouth daily.   Yes [provider]  traMADol (ULTRAM) 50 MG tablet Take 50-100 mg by mouth every 8 (eight) hours as needed for moderate pain.   Yes [provider]  XARELTO 15 MG TABS tablet Take 15 mg by mouth daily. Patient not taking: No sig reported 12/15/19   [provider]   Recent Labs    02/19/20 0355 02/20/20 0539  WBC 6.8 7.2  HGB 9.3* 9.0*  HCT 31.4* 29.1*  PLT 290 275  K 3.7 4.1  CL 106 104  CO2 23 23  BUN 16 14  CREATININE 0.96 0.82  GLUCOSE 160* 138*  CALCIUM 8.7* 8.6*  INR 1.2  --    DG Chest 1 View  Result Date: 02/19/2020 CLINICAL DATA:  80 year old female status post unwitnessed fall. Left femur fracture. EXAM: CHEST  1 VIEW COMPARISON:  None. FINDINGS: Portable AP semi upright view at 0356 hours. Normal lung volumes. Cardiac size at the upper limits of normal. Other mediastinal contours are within normal limits. Visualized tracheal air column is within normal limits. No pneumothorax, pulmonary edema or consolidation. No definite pleural effusion or pulmonary edema. Streaky left lung base opacity near the cardiac apex might be atelectasis or scarring. Paucity of bowel gas in the upper abdomen. No acute osseous abnormality identified. IMPRESSION: 1. Small area of opacity at the left lung base is nonspecific. Atelectasis or scarring favored over infection or aspiration. 2. No other acute cardiopulmonary abnormality. Electronically Signed   By: Odessa Fleming M.D.   On: 02/19/2020 04:33   CT Head Wo Contrast  Result Date: 02/19/2020 CLINICAL DATA:  Unwitnessed fall. EXAM: CT HEAD WITHOUT CONTRAST CT CERVICAL SPINE WITHOUT CONTRAST TECHNIQUE: Multidetector CT imaging of the head and cervical spine was performed following the standard protocol without intravenous contrast. Multiplanar CT image reconstructions of the cervical spine were also generated. COMPARISON:  None. FINDINGS: CT HEAD FINDINGS Brain: Cerebral ventricle sizes are concordant with the degree of cerebral volume loss. Chronic right occipital lobe infarction. Patchy and confluent areas of decreased attenuation are noted throughout the deep and periventricular white matter of the cerebral hemispheres bilaterally, compatible with chronic microvascular ischemic disease. No evidence of large-territorial acute infarction. No parenchymal hemorrhage. No mass lesion. No extra-axial collection. No mass effect or midline shift. No hydrocephalus. Basilar cisterns are patent. Vascular: No hyperdense vessel. Atherosclerotic calcifications are present within the cavernous internal carotid and vertebral arteries. Skull: No acute fracture or focal lesion. Sinuses/Orbits: Paranasal sinuses and mastoid air cells are  clear. Bilateral lens replacement. Otherwise the orbits are unremarkable. Other: None. CT CERVICAL SPINE FINDINGS Alignment: Normal. Skull base and vertebrae: Multilevel degenerative changes of the spine most prominent at the C5-C6 level. No acute fracture. No aggressive appearing focal osseous lesion or focal pathologic process. Soft tissues and spinal canal: No prevertebral fluid or swelling. No visible canal hematoma. Upper chest: Unremarkable. Other: A 1.7 cm peripherally calcified hypodense nodule within the left thyroid gland. Atherosclerotic plaque of the carotid arteries within the neck. Atherosclerotic plaque of the aortic arch. IMPRESSION: 1. No acute intracranial abnormality. 2. No acute  displaced fracture or traumatic listhesis of the cervical spine. 3. A 1.7 cm peripherally calcified hypodense nodule within the left thyroid gland. Recommend thyroid US (ref: J Am Coll Radiol. 2015 Feb;12(2): 143-50). In the setting of significant comorbidities or limited life expectancy, no follow-up recommended (ref: J Am Coll Radiol. 2015 Feb;12(2): 143-50). 4.  Aortic Atherosclerosis (ICD10-I70.0). Electronically Signed   By: Tish Frederickson M.D.   On: 02/19/2020 05:11   CT Cervical Spine Wo Contrast  Result Date: 02/19/2020 CLINICAL DATA:  Unwitnessed fall. EXAM: CT HEAD WITHOUT CONTRAST CT CERVICAL SPINE WITHOUT CONTRAST TECHNIQUE: Multidetector CT imaging of the head and cervical spine was performed following the standard protocol without intravenous contrast. Multiplanar CT image reconstructions of the cervical spine were also generated. COMPARISON:  None. FINDINGS: CT HEAD FINDINGS Brain: Cerebral ventricle sizes are concordant with the degree of cerebral volume loss. Chronic right occipital lobe infarction. Patchy and confluent areas of decreased attenuation are noted throughout the deep and periventricular white matter of the cerebral hemispheres bilaterally, compatible with chronic microvascular ischemic disease. No evidence of large-territorial acute infarction. No parenchymal hemorrhage. No mass lesion. No extra-axial collection. No mass effect or midline shift. No hydrocephalus. Basilar cisterns are patent. Vascular: No hyperdense vessel. Atherosclerotic calcifications are present within the cavernous internal carotid and vertebral arteries. Skull: No acute fracture or focal lesion. Sinuses/Orbits: Paranasal sinuses and mastoid air cells are clear. Bilateral lens replacement. Otherwise the orbits are unremarkable. Other: None. CT CERVICAL SPINE FINDINGS Alignment: Normal. Skull base and vertebrae: Multilevel degenerative changes of the spine most prominent at the C5-C6 level. No acute fracture. No  aggressive appearing focal osseous lesion or focal pathologic process. Soft tissues and spinal canal: No prevertebral fluid or swelling. No visible canal hematoma. Upper chest: Unremarkable. Other: A 1.7 cm peripherally calcified hypodense nodule within the left thyroid gland. Atherosclerotic plaque of the carotid arteries within the neck. Atherosclerotic plaque of the aortic arch. IMPRESSION: 1. No acute intracranial abnormality. 2. No acute displaced fracture or traumatic listhesis of the cervical spine. 3. A 1.7 cm peripherally calcified hypodense nodule within the left thyroid gland. Recommend thyroid US (ref: J Am Coll Radiol. 2015 Feb;12(2): 143-50). In the setting of significant comorbidities or limited life expectancy, no follow-up recommended (ref: J Am Coll Radiol. 2015 Feb;12(2): 143-50). 4.  Aortic Atherosclerosis (ICD10-I70.0). Electronically Signed   By: Tish Frederickson M.D.   On: 02/19/2020 05:11   DG HIP UNILAT WITH PELVIS 2-3 VIEWS LEFT  Result Date: 02/19/2020 CLINICAL DATA:  80 year old female status post unwitnessed fall. Left hip pain. EXAM: DG HIP (WITH OR WITHOUT PELVIS) 2-3V LEFT COMPARISON:  None. FINDINGS: Extensive aortoiliac and proximal right femoral artery vascular calcifications. Femoral heads remain normally located. The pelvis appears intact. Negative visible bowel gas pattern. Grossly intact proximal right femur. Comminuted left intertrochanteric fracture with medial displacement 1/2 shaft width, over riding of 2-3 cm, anterior displacement 1/2 shaft  width and mild posterior angulation. IMPRESSION: Comminuted left intertrochanteric fracture with displacement, overriding of fragments. Electronically Signed   By: Odessa Fleming M.D.   On: 02/19/2020 04:32     Positive ROS: All other systems have been reviewed and were otherwise negative with the exception of those mentioned in the HPI and as above.  Physical Exam: BP (!) 144/51 (BP Location: Right Arm)   Pulse 80   Temp 98.1 F  (36.7 C) (Oral)   Resp 17   Ht 5\' 1"  (1.549 m)   Wt 64.4 kg   SpO2 94%   BMI 26.83 kg/m  General:  Alert, no acute distress Psychiatric:  Patient is not competent for consent, nonagitated Cardiovascular:  No pedal edema, regular rate and rhythm Respiratory:  No wheezing, non-labored breathing GI:  Abdomen is soft and non-tender Skin:  No lesions in the area of chief complaint, no erythema Neurologic:  Sensation intact distally, CN grossly intact Lymphatic:  No axillary or cervical lymphadenopathy  Orthopedic Exam:  LLE: + DF/PF/EHL SILT grossly over foot Foot wwp +Log roll/axial load   X-rays:  As above: L intertrochanteric reverse obliquity hip fracture  Assessment/Plan: Sinaya Minogue is a 80 y.o. female with a L intertrochanteric reverse obliquity hip fracture   1. I discussed the various treatment options including both surgical and non-surgical management of the fracture with the patient and/or family (medical PoA). We discussed the high risk of perioperative complications due to patient's age, vascular dementia, and other co-morbidities including but not limited to diabetes, existing DVT, history of GI bleed, atrial fibrillation, and prior CVA. After discussion of risks, benefits, and alternatives to surgery, the family and/or patient were in agreement to proceed with surgery. The goals of surgery would be to provide adequate pain relief and allow for mobilization. Plan for surgery is L hip cephalomedullary nailing today, 02/20/2020. 2. NPO until OR 3. Hold anticoagulation in advance of OR.  I did discuss anticoagulation postoperatively with the patient's daughter, and we agreed to chemoprophylaxis for while the patient was an inpatient in the hospital and we will plan to not continue this after discharge from the hospital due to increased risk of GI bleed after being on anticoagulation for greater than 2 weeks.    02/22/2020   02/20/2020 8:02 AM

## 2020-02-20 NOTE — Op Note (Signed)
DATE OF SURGERY: 02/20/2020  PREOPERATIVE DIAGNOSIS: Left reverse obliquity intertrochanteric hip fracture  POSTOPERATIVE DIAGNOSIS: Left reverse obliquity intertrochanteric hip fracture  PROCEDURE: Intramedullary nailing of L femur with cephalomedullary device  SURGEON: Rosealee Albee, MD  ANESTHESIA: Gen  EBL: 100 cc  IVF: per anesthesia record  COMPONENTS:  [employer] Trigen Intertan Long Nail: 10x355mm; 18mm lag screw with 79mm compression screw; 5x42.69mm & 5x75mm distal cortical interlocking screws  INDICATIONS: Cassi Jenne is a 80 y.o. female who sustained a reverse obliquity intertrochanteric fracture after a fall. Risks and benefits of intramedullary nailing were explained to the patient and/or family . Risks include but are not limited to bleeding, infection, injury to tissues, nerves, vessels, nonunion/malunion, hardware failure, limb length discrepancy/hip rotation mismatch and risks of anesthesia. The patient and/or family understand these risks, have completed an informed consent, and wish to proceed.   PROCEDURE:  The patient was brought into the operating room. After administering anesthesia, the patient was placed in the supine position on the Hana table. The uninjured leg was placed in an extended position while the injured lower extremity was placed in longitudinal traction. The fracture was reduced using longitudinal traction and internal rotation. The adequacy of reduction was verified fluoroscopically in AP and lateral projections and found to be acceptable with appropriate medial calcar overlap, no varus alignment, and appropriate angulation and anterior cortex overalap on the lateral view. There was some slight gapping of the lateral cortex. The lateral aspect of the hip and thigh were prepped with ChloraPrep solution before being draped sterilely. Preoperative IV antibiotics were administered. A timeout was performed to verify the appropriate surgical site,  patient, and procedure.    The greater trochanter was identified and an approximately 6 cm incision was made about 3 fingerbreadths above the tip of the greater trochanter. The incision was carried down through the subcutaneous tissues to expose the gluteal fascia. This was split the length of the incision, providing access to the tip of the trochanter. Under fluoroscopic guidance, a guidewire was drilled through the tip of the trochanter into the proximal metaphysis to the level of the lesser trochanter. After verifying its position fluoroscopically in AP and lateral projections, it was overreamed with the opening reamer to the level of the lesser trochanter. A guidewire was passed down through the femoral canal to the supracondylar region.   Due to the slight gapping of the lateral cortex, prior to reaming, a more improved reduction was desired.  An approximately 10 cm incision was made over the lateral aspect of the proximal femur.  Dissection was carried down sharply through subcutaneous tissue, IT band, and vastus lateralis muscle until the lateral femoral cortex was reached.  Using a wide key elevator, the lateral cortex of the proximal fracture fragment was pushed medially.  This improved the lateral cortex alignment while maintaining the medial calcar overlap.  Lateral radiographic views were still showing appropriate alignment and reduction.  While holding this position, the guidewire was overreamed sequentially using the flexible reamers. The nail was selected and advanced to the appropriate depth as verified fluoroscopically.    The guide system for the lag screw was positioned and advanced through an approximately 5cm incision over the lateral aspect of the proximal femur. The guidewire was drilled up through the femoral nail and into the femoral neck to rest within 5 mm of subchondral bone. After verifying its position in the femoral neck and head in both AP and lateral projections, the guidewire  was  measured and appropriate sized lag screw was selected.  The channel for the compression screw was drilled and antirotation bar was placed.  Lag screw was drilled and placed in appropriate position.  Compression screw was then placed.  Appropriate compression was achieved.  The set screw was locked in place. Again, the adequacy of hardware position and fracture reduction was verified fluoroscopically in AP and lateral projections.   Attention was directed distally. Using the "perfect circle" technique, the leg and fluoroscopy machine were positioned appropriately. A 2cm stab incision was made over the skin and IT band at the appropriate point before the drill bit was advanced through the cortex and across the static hole of the nail. Appropriate screw length was determined with a measuring guide. A distal interlocking screw was placed.  This process was repeated for a second distal interlocking screw through the oblong hole.  Again, the adequacy of screw position was verified fluoroscopically in AP and lateral projections.   The wounds were irrigated thoroughly with sterile saline solution. Local anesthetic was injected into the wounds. Deep fascia was closed with 0-Vicryl. The subcutaneous tissues were closed using 2-0 Vicryl interrupted sutures. The skin was closed using staples. Sterile occlusive dressings were applied to all wounds. The patient was then transferred to the recovery room in satisfactory condition.   POSTOPERATIVE PLAN: The patient will be WBAT on the operative extremity. Lovenox 40mg /day while admitted as an inpatient and then to be discontinued given history of significant GI bleeding with greater than 2 weeks of anticoagulation.  This was discussed preoperatively with the patient's family. Perioperative IV antibiotics x 24 hours. PT/OT on POD#1.

## 2020-02-20 NOTE — H&P (Signed)
H&P reviewed. No significant changes noted.  

## 2020-02-20 NOTE — Progress Notes (Signed)
   02/20/20 1727  Assess: MEWS Score  Temp 98.2 F (36.8 C)  BP 98/71  Pulse Rate 90  Resp (!) 21  SpO2 100 %  O2 Device Room Air  Assess: MEWS Score  MEWS Temp 0  MEWS Systolic 1  MEWS Pulse 0  MEWS RR 1  MEWS LOC 0  MEWS Score 2  MEWS Score Color Yellow  Assess: if the MEWS score is Yellow or Red  Were vital signs taken at a resting state? Yes  Focused Assessment Change from prior assessment (see assessment flowsheet)  Early Detection of Sepsis Score *See Row Information* Medium  MEWS guidelines implemented *See Row Information* Yes  Treat  MEWS Interventions Administered scheduled meds/treatments  Pain Scale 0-10  Pain Score 10  Pain Type Surgical pain  Pain Location Hip  Pain Orientation Left  Notify: Charge Nurse/RN  Name of Charge Nurse/RN Notified Anitra, RN  Date Charge Nurse/RN Notified 02/20/20  Time Charge Nurse/RN Notified 1730  Notify: Provider  Provider Name/Title Adrian Prows MD  Date Provider Notified 02/20/20  Time Provider Notified 1736  Notification Type Page  Response See new orders

## 2020-02-20 NOTE — Anesthesia Postprocedure Evaluation (Signed)
Anesthesia Post Note  Patient: Virginia Mann  Procedure(s) Performed: INTRAMEDULLARY (IM) NAIL INTERTROCHANTRIC (Left )  Patient location during evaluation: PACU Anesthesia Type: Spinal Level of consciousness: awake and alert Pain management: pain level controlled Vital Signs Assessment: post-procedure vital signs reviewed and stable Respiratory status: spontaneous breathing and respiratory function stable Cardiovascular status: stable Anesthetic complications: no   No complications documented.   Last Vitals:  Vitals:   02/20/20 1126 02/20/20 1544  BP: (!) 173/71 (!) 154/106  Pulse: 74 87  Resp: 17 17  Temp: 36.6 C 36.7 C  SpO2: 97%     Last Pain:  Vitals:   02/20/20 1544  TempSrc: Oral  PainSc:                  [name]

## 2020-02-20 NOTE — Progress Notes (Signed)
Pt in OR this am. Will follow as needed.

## 2020-02-20 NOTE — Transfer of Care (Signed)
Immediate Anesthesia Transfer of Care Note  Patient: Virginia Mann  Procedure(s) Performed: INTRAMEDULLARY (IM) NAIL INTERTROCHANTRIC (Left )  Patient Location: PACU  Anesthesia Type:General  Level of Consciousness: drowsy  Airway & Oxygen Therapy: Patient Spontanous Breathing and Patient connected to face mask oxygen  Post-op Assessment: Report given to RN and Post -op Vital signs reviewed and stable  Post vital signs: Reviewed and stable  Last Vitals:  Vitals Value Taken Time  BP 199/73 02/20/20 1033  Temp    Pulse 83 02/20/20 1032  Resp 17 02/20/20 1037  SpO2 86 % 02/20/20 1032  Vitals shown include unvalidated device data.  Last Pain:  Vitals:   02/20/20 0447  TempSrc: Oral  PainSc:       Patients Stated Pain Goal: 0 (02/19/20 0355)  Complications: No complications documented.

## 2020-02-20 NOTE — Anesthesia Procedure Notes (Signed)
Procedure Name: Intubation Date/Time: 02/20/2020 8:51 AM Performed by: Hezzie Bump, CRNA Pre-anesthesia Checklist: Patient identified, Patient being monitored, Timeout performed, Emergency Drugs available and Suction available Patient Re-evaluated:Patient Re-evaluated prior to induction Oxygen Delivery Method: Circle system utilized Preoxygenation: Pre-oxygenation with 100% oxygen Induction Type: IV induction Ventilation: Mask ventilation without difficulty Laryngoscope Size: 3 and McGraph Grade View: Grade I Tube type: Oral Tube size: 7.0 mm Number of attempts: 1 Airway Equipment and Method: Video-laryngoscopy Placement Confirmation: ETT inserted through vocal cords under direct vision,  positive ETCO2 and breath sounds checked- equal and bilateral Secured at: 21 cm Tube secured with: Tape Dental Injury: Teeth and Oropharynx as per pre-operative assessment

## 2020-02-20 NOTE — Progress Notes (Signed)
Pt been NPO at midnight. Transport here to take to [facility], broadcasting/film/video.

## 2020-02-20 NOTE — Anesthesia Preprocedure Evaluation (Signed)
Anesthesia Evaluation  Patient identified by MRN, date of birth, ID band Patient awake    Reviewed: Allergy & Precautions, NPO status , Patient's Chart, lab work & pertinent test results  History of Anesthesia Complications Negative for: history of anesthetic complications  Airway Mallampati: II       Dental   Pulmonary neg sleep apnea, neg COPD, Not current smoker, former smoker,           Cardiovascular hypertension, Pt. on medications +CHF  (-) Past MI + dysrhythmias Atrial Fibrillation (-) Valvular Problems/Murmurs     Neuro/Psych neg Seizures CVA, No Residual Symptoms    GI/Hepatic Neg liver ROS, neg GERD  ,  Endo/Other  diabetes, Type 2, Oral Hypoglycemic Agents  Renal/GU negative Renal ROS     Musculoskeletal   Abdominal   Peds  Hematology  (+) anemia ,   Anesthesia Other Findings   Reproductive/Obstetrics                             Anesthesia Physical Anesthesia Plan  ASA: III and emergent  Anesthesia Plan: Spinal   Post-op Pain Management:    Induction: Intravenous  PONV Risk Score and Plan: 2  Airway Management Planned:   Additional Equipment:   Intra-op Plan:   Post-operative Plan:   Informed Consent: I have reviewed the patients History and Physical, chart, labs and discussed the procedure including the risks, benefits and alternatives for the proposed anesthesia with the patient or authorized representative who has indicated his/her understanding and acceptance.       Plan Discussed with:   Anesthesia Plan Comments:         Anesthesia Quick Evaluation

## 2020-02-21 ENCOUNTER — Encounter: Payer: Self-pay | Admitting: Orthopedic Surgery

## 2020-02-21 DIAGNOSIS — D649 Anemia, unspecified: Secondary | ICD-10-CM | POA: Diagnosis not present

## 2020-02-21 DIAGNOSIS — F0390 Unspecified dementia without behavioral disturbance: Secondary | ICD-10-CM | POA: Diagnosis not present

## 2020-02-21 DIAGNOSIS — I10 Essential (primary) hypertension: Secondary | ICD-10-CM | POA: Diagnosis not present

## 2020-02-21 DIAGNOSIS — S72002A Fracture of unspecified part of neck of left femur, initial encounter for closed fracture: Secondary | ICD-10-CM | POA: Diagnosis not present

## 2020-02-21 LAB — HEMOGLOBIN AND HEMATOCRIT, BLOOD
HCT: 26.7 % — ABNORMAL LOW (ref 36.0–46.0)
Hemoglobin: 8.3 g/dL — ABNORMAL LOW (ref 12.0–15.0)

## 2020-02-21 LAB — BASIC METABOLIC PANEL
Anion gap: 10 (ref 5–15)
BUN: 27 mg/dL — ABNORMAL HIGH (ref 8–23)
CO2: 24 mmol/L (ref 22–32)
Calcium: 8.3 mg/dL — ABNORMAL LOW (ref 8.9–10.3)
Chloride: 102 mmol/L (ref 98–111)
Creatinine, Ser: 0.98 mg/dL (ref 0.44–1.00)
GFR, Estimated: 59 mL/min — ABNORMAL LOW (ref >60)
Glucose, Bld: 152 mg/dL — ABNORMAL HIGH (ref 70–99)
Potassium: 4.2 mmol/L (ref 3.5–5.1)
Sodium: 136 mmol/L (ref 135–145)

## 2020-02-21 LAB — GLUCOSE, CAPILLARY
Glucose-Capillary: 119 mg/dL — ABNORMAL HIGH (ref 70–99)
Glucose-Capillary: 143 mg/dL — ABNORMAL HIGH (ref 70–99)
Glucose-Capillary: 154 mg/dL — ABNORMAL HIGH (ref 70–99)
Glucose-Capillary: 156 mg/dL — ABNORMAL HIGH (ref 70–99)
Glucose-Capillary: 170 mg/dL — ABNORMAL HIGH (ref 70–99)
Glucose-Capillary: 171 mg/dL — ABNORMAL HIGH (ref 70–99)

## 2020-02-21 LAB — CBC
HCT: 21.9 % — ABNORMAL LOW (ref 36.0–46.0)
Hemoglobin: 6.8 g/dL — ABNORMAL LOW (ref 12.0–15.0)
MCH: 26.9 pg (ref 26.0–34.0)
MCHC: 31.1 g/dL (ref 30.0–36.0)
MCV: 86.6 fL (ref 80.0–100.0)
Platelets: 221 10*3/uL (ref 150–400)
RBC: 2.53 MIL/uL — ABNORMAL LOW (ref 3.87–5.11)
RDW: 18.4 % — ABNORMAL HIGH (ref 11.5–15.5)
WBC: 6.9 10*3/uL (ref 4.0–10.5)
nRBC: 0 % (ref 0.0–0.2)

## 2020-02-21 LAB — PREPARE RBC (CROSSMATCH)

## 2020-02-21 LAB — ABO/RH: ABO/RH(D): O POS

## 2020-02-21 MED ORDER — SODIUM CHLORIDE 0.9% IV SOLUTION: Freq: Once | INTRAVENOUS | Status: AC

## 2020-02-21 MED ORDER — TRAMADOL HCL 50 MG PO TABS: 50.0000 mg | ORAL_TABLET | Freq: Four times a day (QID) | ORAL | 0 refills | Status: AC | PRN

## 2020-02-21 MED ORDER — OXYCODONE HCL 5 MG PO TABS: 2.5000 mg | ORAL_TABLET | ORAL | 0 refills | Status: AC | PRN

## 2020-02-21 MED ORDER — ENOXAPARIN SODIUM 40 MG/0.4ML ~~LOC~~ SOLN: 40.0000 mg | SUBCUTANEOUS | 0 refills | Status: AC

## 2020-02-21 NOTE — Discharge Instructions (Signed)
INSTRUCTIONS AFTER Surgery  o Remove items at home which could result in a fall. This includes throw rugs or furniture in walking pathways o ICE to the affected joint every three hours while awake for 30 minutes at a time, for at least the first 3-5 days, and then as needed for pain and swelling.  Continue to use ice for pain and swelling. You may notice swelling that will progress down to the foot and ankle.  This is normal after surgery.  Elevate your leg when you are not up walking on it.   o Continue to use the breathing machine you got in the hospital (incentive spirometer) which will help keep your temperature down.  It is common for your temperature to cycle up and down following surgery, especially at night when you are not up moving around and exerting yourself.  The breathing machine keeps your lungs expanded and your temperature down.   DIET:  As you were doing prior to hospitalization, we recommend a well-balanced diet.  DRESSING / WOUND CARE / SHOWERING  Dressing change as needed.  Staples will be removed in 2 weeks at [facility] clinic orthopedics.  Patient will follow-up for x-rays of the left femur as well.  No showering.  ACTIVITY  o Increase activity slowly as tolerated, but follow the weight bearing instructions below.   o No driving for 6 weeks or until further direction given by your physician.  You cannot drive while taking narcotics.  o No lifting or carrying greater than 10 lbs. until further directed by your surgeon. o Avoid periods of inactivity such as sitting longer than an hour when not asleep. This helps prevent blood clots.  o You may return to work once you are authorized by your doctor.     WEIGHT BEARING  Weightbearing as tolerated on the left.   EXERCISES Gait training and ambulation with occupational therapy doing ADLs.  CONSTIPATION  Constipation is defined medically as fewer than three stools per week and severe constipation as less than one stool  per week.  Even if you have a regular bowel pattern at home, your normal regimen is likely to be disrupted due to multiple reasons following surgery.  Combination of anesthesia, postoperative narcotics, change in appetite and fluid intake all can affect your bowels.   YOU MUST use at least one of the following options; they are listed in order of increasing strength to get the job done.  They are all available over the counter, and you may need to use some, POSSIBLY even all of these options:    Drink plenty of fluids (prune juice may be helpful) and high fiber foods Colace 100 mg by mouth twice a day  Senokot for constipation as directed and as needed Dulcolax (bisacodyl), take with full glass of water  Miralax (polyethylene glycol) once or twice a day as needed.  If you have tried all these things and are unable to have a bowel movement in the first 3-4 days after surgery call either your surgeon or your primary doctor.    If you experience loose stools or diarrhea, hold the medications until you stool forms back up.  If your symptoms do not get better within 1 week or if they get worse, check with your doctor.  If you experience "the worst abdominal pain ever" or develop nausea or vomiting, please contact the office immediately for further recommendations for treatment.   ITCHING:  If you experience itching with your medications, try taking only  a single pain pill, or even half a pain pill at a time.  You can also use Benadryl over the counter for itching or also to help with sleep.   TED HOSE STOCKINGS:  Use stockings on both legs until for at least 2 weeks or as directed by physician office. They may be removed at night for sleeping.  MEDICATIONS:  See your medication summary on the "After Visit Summary" that nursing will review with you.  You may have some home medications which will be placed on hold until you complete the course of blood thinner medication.  It is important for you to  complete the blood thinner medication as prescribed.  PRECAUTIONS:  If you experience chest pain or shortness of breath - call 911 immediately for transfer to the hospital emergency department.   If you develop a fever greater that 101 F, purulent drainage from wound, increased redness or drainage from wound, foul odor from the wound/dressing, or calf pain - CONTACT YOUR SURGEON.                                                   FOLLOW-UP APPOINTMENTS:  If you do not already have a post-op appointment, please call the office for an appointment to be seen by your surgeon.  Guidelines for how soon to be seen are listed in your "After Visit Summary", but are typically between 1-4 weeks after surgery.  OTHER INSTRUCTIONS:     MAKE SURE YOU:  . Understand these instructions.  . Get help right away if you are not doing well or get worse.    Thank you for letting us be a part of your medical care team.  It is a privilege we respect greatly.  We hope these instructions will help you stay on track for a fast and full recovery!

## 2020-02-21 NOTE — Progress Notes (Signed)
  Subjective: 1 Day Post-Op Procedure(s) (LRB): INTRAMEDULLARY (IM) NAIL INTERTROCHANTRIC (Left) Patient reports pain as moderate.   Patient is well, and has had no acute complaints or problems Plan is to go Skilled nursing facility after hospital stay. Negative for chest pain and shortness of breath Fever: no Gastrointestinal: Negative for nausea and vomiting  Objective: Vital signs in last 24 hours: Temp:  [97.9 F (36.6 C)-99.1 F (37.3 C)] 98.1 F (36.7 C) (02/14 0126) Pulse Rate:  [71-101] 84 (02/14 0126) Resp:  [13-21] 16 (02/14 0126) BP: (98-199)/(55-114) 141/55 (02/14 0126) SpO2:  [96 %-100 %] 97 % (02/14 0126)  Intake/Output from previous day:  Intake/Output Summary (Last 24 hours) at 02/21/2020 0658 Last data filed at 02/21/2020 0320 Gross per 24 hour  Intake 1282.76 ml  Output 100 ml  Net 1182.76 ml    Intake/Output this shift: Total I/O In: 182.8 [I.V.:82.8; IV Piggyback:100] Out: -   Labs: Recent Labs    02/19/20 0355 02/20/20 0539 02/21/20 0432  HGB 9.3* 9.0* 6.8*   Recent Labs    02/20/20 0539 02/21/20 0432  WBC 7.2 6.9  RBC 3.36* 2.53*  HCT 29.1* 21.9*  PLT 275 221   Recent Labs    02/20/20 0539 02/21/20 0432  NA 139 136  K 4.1 4.2  CL 104 102  CO2 23 24  BUN 14 27*  CREATININE 0.82 0.98  GLUCOSE 138* 152*  CALCIUM 8.6* 8.3*   Recent Labs    02/19/20 0355  INR 1.2     EXAM General - Patient is Alert and Oriented Extremity - Neurovascular intact Sensation intact distally Dorsiflexion/Plantar flexion intact Compartment soft Dressing/Incision - clean, dry, no drainage Motor Function - intact, moving foot and toes well on exam.   Past Medical History:  Diagnosis Date  . Chronic a-fib (HCC)   . Chronic diastolic (congestive) heart failure (HCC)   . Diabetes mellitus without complication (HCC)   . DVT (deep venous thrombosis) (HCC)   . HTN (hypertension)   . Renal disorder   . Stroke [facility])     Assessment/Plan: 1 Day  Post-Op Procedure(s) (LRB): INTRAMEDULLARY (IM) NAIL INTERTROCHANTRIC (Left) Principal Problem:   Closed left hip fracture (HCC) Active Problems:   Diabetes mellitus without complication (HCC)   Normocytic anemia   HTN (hypertension)   Stroke (HCC)   Chronic a-fib (HCC)   Chronic diastolic (congestive) heart failure (HCC)   Fall   Thyroid nodule   Dementia without behavioral disturbance (HCC)  Estimated body mass index is 26.83 kg/m as calculated from the following:   Height as of this encounter: 5\' 1"  (1.549 m).   Weight as of this encounter: 64.4 kg. Advance diet Up with therapy  Acute blood loss anemia hemoglobin 6.8.  Transfusion per internal medicine.  Recheck labs. Follow-up at [facility] clinic orthopedics in 2 weeks for staple removal and left femur x-rays.  DVT Prophylaxis - Lovenox, Foot Pumps and TED hose Weight-Bearing as tolerated to left leg  [facility], PA-C Orthopaedic Surgery 02/21/2020, 6:58 AM

## 2020-02-21 NOTE — Progress Notes (Signed)
OT Cancellation Note  Patient Details Name: Virginia Mann MRN: [id number] DOB: Oct 23, 1940   Cancelled Treatment:    Reason Eval/Treat Not Completed: Medical issues which prohibited therapy. Chart reviewed - pt noted to have Hgb 6.8 and HCT 21.9 critically low; contraindicated for exertional activity at this time. Will continue to follow and initiate services as pt medically appropriate to participate in therapy.   Kathie Dike, M.S. OTR/L  02/21/20, 8:46 AM  ascom [phone]

## 2020-02-21 NOTE — Progress Notes (Signed)
PT Cancellation Note  Patient Details Name: Virginia Mann MRN: [id number] DOB: March 07, 1940   Cancelled Treatment:    Reason Eval/Treat Not Completed: Patient not medically ready PT orders received, chart reviewed. Pt noted to have Hgb of 6.8. Exertional activity contraindicated at this time. Will f/u as pt is medically appropriate.   Aleda Grana, PT, DPT 02/21/20, 8:39 AM    Sandi Mariscal 02/21/2020, 8:39 AM

## 2020-02-21 NOTE — Progress Notes (Signed)
[facility]  - Seth Ward at [facility]   PATIENT NAME: Virginia Mann    MR#:  [id number]  DATE OF BIRTH:  [birth year]  SUBJECTIVE:  patient came in from [facility] after she had a mechanical fall. Sustained left hip fracture.  Cont with mild hip pain hgb 6.8  REVIEW OF SYSTEMS:   Review of Systems  Constitutional: Negative for chills, fever and weight loss.  HENT: Negative for ear discharge, ear pain and nosebleeds.   Eyes: Negative for blurred vision, pain and discharge.  Respiratory: Negative for sputum production, shortness of breath, wheezing and stridor.   Cardiovascular: Negative for chest pain, palpitations, orthopnea and PND.  Gastrointestinal: Negative for abdominal pain, diarrhea, nausea and vomiting.  Genitourinary: Negative for frequency and urgency.  Musculoskeletal: Positive for back pain. Negative for joint pain.  Neurological: Negative for sensory change, speech change, focal weakness and weakness.  Psychiatric/Behavioral: Negative for depression and hallucinations. The patient is not nervous/anxious.    Tolerating Diet:yesTolerating PT: pending  DRUG ALLERGIES:   Allergies  Allergen Reactions  . Apixaban Itching and Rash    VITALS:  Blood pressure (!) 141/55, pulse 84, temperature 98.1 F (36.7 C), resp. rate 16, height 5\' 1"  (1.549 m), weight 64.4 kg, SpO2 97 %.  PHYSICAL EXAMINATION:   Physical Exam  GENERAL:  80 y.o.-year-old patient lying in the bed with no acute distress. fraile LUNGS: Normal breath sounds bilaterally, no wheezing, rales, rhonchi. No use of accessory muscles of respiration.  CARDIOVASCULAR: S1, S2 normal. No murmurs, rubs, or gallops.  ABDOMEN: Soft, nontender, nondistended. Bowel sounds present. No organomegaly or mass.  EXTREMITIES: No cyanosis, clubbing or edema b/l.   Surgical dressing left hip NEUROLOGIC: grossly nonfocal. PSYCHIATRIC:  patient is alert and awake SKIN: No obvious rash, lesion, or ulcer.    LABORATORY PANEL:  CBC Recent Labs  Lab 02/21/20 0432  WBC 6.9  HGB 6.8*  HCT 21.9*  PLT 221    Chemistries  Recent Labs  Lab 02/19/20 0355 02/20/20 0539 02/21/20 0432  NA 141   < > 136  K 3.7   < > 4.2  CL 106   < > 102  CO2 23   < > 24  GLUCOSE 160*   < > 152*  BUN 16   < > 27*  CREATININE 0.96   < > 0.98  CALCIUM 8.7*   < > 8.3*  AST 17  --   --   ALT 11  --   --   ALKPHOS 87  --   --   BILITOT 0.5  --   --    < > = values in this interval not displayed.   Cardiac Enzymes No results for input(s): TROPONINI in the last 168 hours. RADIOLOGY:  DG HIP OPERATIVE UNILAT W OR W/O PELVIS LEFT  Result Date: 02/20/2020 CLINICAL DATA:  Left hip ORIF. EXAM: OPERATIVE LEFT HIP (WITH PELVIS IF PERFORMED) TECHNIQUE: Fluoroscopic spot image(s) were submitted for interpretation post-operatively. FLUOROSCOPY TIME:  2 minutes, 57 seconds. C-arm fluoroscopic images were obtained intraoperatively and submitted for post operative interpretation. COMPARISON:  Left hip x-rays from yesterday. FINDINGS: Four intraoperative fluoroscopic images demonstrate interval cephalomedullary rod fixation of the left intertrochanteric femur fracture, now in anatomic alignment. IMPRESSION: Intraoperative fluoroscopic guidance for left intertrochanteric femur fracture ORIF. Electronically Signed   By: 02/22/2020 M.D.   On: 02/20/2020 12:06   ASSESSMENT AND PLAN:  Virginia Mann is a 80 y.o. female with medical history significant  of hypertension, hyperlipidemia, diabetes mellitus, stroke, GERD, depression, atrial fibrillation on anticoagulants, GI bleeding, vascular dementia, DVT, who presents with fall and left hip pain.  Closed left hip fracture: As evidenced by x-ray. Patient has severe pain now. No neurovascular compromise. -- Orthopedic surgeon, Dr. Allena Katz  consult appreciated -POD "1" left hip intramedullary nailing of left femur - Pain control: dilaudid prn and percocet - When necessary  Zofran for nausea - Robaxin for muscle spasm  Normocytic anemia Post-op anemia --hgb 10.3--9.0--6.8--1 unit BT --d/w dter Virginia Mann --ok with transfusion  Fall:  - PT/OT from today  Diabetes mellitus without complication (HCC): No A1c on record.   --Patient taking Metformin.  Patient daughter states that patient was very sensitive to insulin, easily develop hypoglycemia with insulin treatment -no SSI per dter's request  HTN (hypertension) -IV hydralazine as needed -Lotensin  Stroke (HCC) -Lipitor  Chronic a-fib (HCC): Stopped taking Xarelto due to GI bleeding.  Heart rate is 74. -Telemetry monitor --seen by Dr Lady Gary  Chronic diastolic (congestive) heart failure [facility]): 2D echo 11/08/19 showed EF> 50%.  Patient does not have leg edema or JVD.  CHF seem to be compensated.  Thyroid nodule: CT of the C-spine incidentally showed thyroid nodule -Follow-up with PCP  Nutrition Status: Nutrition Problem: Increased nutrient needs Etiology: post-op healing,hip fracture Signs/Symptoms: estimated needs Interventions: Premier Protein,MVI,Juven   DVT ppx: SCD Code Status: DNR prior to admission Family Communication:  Yes, patient's daughter Virginia Mann by phone Disposition Plan: pending PT eval Consults called:  Dr. Allena Katz of ortho and Dr. Lady Gary of card are consulted. Admission status and Level of care: Med-Surg:   as inpt      Status is: Inpatient  Remains inpatient appropriate because:Inpatient level of care appropriate due to severity of illness   Dispo: The patient is from: SNF  Anticipated d/c is to: SNF  Anticipated d/c date is: 2 days  Patient currently is not medically stable to d/c.              Difficult to place patient No   Level of care: Med-Surg          TOTAL TIME TAKING CARE OF THIS PATIENT: *25* minutes.  >50% time spent on counselling and coordination of care  Note: This dictation was prepared with Dragon  dictation along with smaller phrase technology. Any transcriptional errors that result from this process are unintentional.  Virginia Mann M.D    [facility]   CC: Primary care physician; Virginia Mann, MDPatient ID: Virginia Mann, female   DOB: Aug 02, 1940, 80 y.o.   MRN: [id number]

## 2020-02-22 ENCOUNTER — Inpatient Hospital Stay: Payer: Medicare Other

## 2020-02-22 DIAGNOSIS — S72002A Fracture of unspecified part of neck of left femur, initial encounter for closed fracture: Secondary | ICD-10-CM | POA: Diagnosis not present

## 2020-02-22 DIAGNOSIS — F0390 Unspecified dementia without behavioral disturbance: Secondary | ICD-10-CM | POA: Diagnosis not present

## 2020-02-22 DIAGNOSIS — D649 Anemia, unspecified: Secondary | ICD-10-CM | POA: Diagnosis not present

## 2020-02-22 DIAGNOSIS — I10 Essential (primary) hypertension: Secondary | ICD-10-CM | POA: Diagnosis not present

## 2020-02-22 LAB — BASIC METABOLIC PANEL
Anion gap: 8 (ref 5–15)
BUN: 37 mg/dL — ABNORMAL HIGH (ref 8–23)
CO2: 25 mmol/L (ref 22–32)
Calcium: 8.2 mg/dL — ABNORMAL LOW (ref 8.9–10.3)
Chloride: 103 mmol/L (ref 98–111)
Creatinine, Ser: 0.94 mg/dL (ref 0.44–1.00)
GFR, Estimated: >60 mL/min (ref >60)
Glucose, Bld: 139 mg/dL — ABNORMAL HIGH (ref 70–99)
Potassium: 4.4 mmol/L (ref 3.5–5.1)
Sodium: 136 mmol/L (ref 135–145)

## 2020-02-22 LAB — TYPE AND SCREEN
ABO/RH(D): O POS
Antibody Screen: NEGATIVE
Unit division: 0

## 2020-02-22 LAB — GLUCOSE, CAPILLARY
Glucose-Capillary: 156 mg/dL — ABNORMAL HIGH (ref 70–99)
Glucose-Capillary: 160 mg/dL — ABNORMAL HIGH (ref 70–99)
Glucose-Capillary: 175 mg/dL — ABNORMAL HIGH (ref 70–99)
Glucose-Capillary: 176 mg/dL — ABNORMAL HIGH (ref 70–99)

## 2020-02-22 LAB — CBC
HCT: 27.1 % — ABNORMAL LOW (ref 36.0–46.0)
Hemoglobin: 8.3 g/dL — ABNORMAL LOW (ref 12.0–15.0)
MCH: 26.4 pg (ref 26.0–34.0)
MCHC: 30.6 g/dL (ref 30.0–36.0)
MCV: 86.3 fL (ref 80.0–100.0)
Platelets: 235 10*3/uL (ref 150–400)
RBC: 3.14 MIL/uL — ABNORMAL LOW (ref 3.87–5.11)
RDW: 18.4 % — ABNORMAL HIGH (ref 11.5–15.5)
WBC: 8.2 10*3/uL (ref 4.0–10.5)
nRBC: 0 % (ref 0.0–0.2)

## 2020-02-22 LAB — BPAM RBC
Blood Product Expiration Date: 202203192359
ISSUE DATE / TIME: 202202141234
Unit Type and Rh: 5100

## 2020-02-22 MED ORDER — OXYCODONE HCL 5 MG PO TABS
5.0000 mg | ORAL_TABLET | ORAL | Status: DC | PRN
  Administered 2020-02-22 – 2020-02-23 (×4): 5 mg via ORAL
  Filled 2020-02-22 (×4): qty 1

## 2020-02-22 MED ORDER — POLYETHYLENE GLYCOL 3350 17 G PO PACK
17.0000 g | PACK | Freq: Every day | ORAL | Status: DC
  Administered 2020-02-22 – 2020-02-23 (×2): 17 g via ORAL
  Filled 2020-02-22 (×2): qty 1

## 2020-02-22 NOTE — Evaluation (Signed)
Occupational Therapy Evaluation Patient Details Name: Virginia Mann MRN: [id number] DOB: 05-18-1940 Today's Date: 02/22/2020    History of Present Illness Pt admitted on 02/19/20 from SNF with c/c of fall & L hip pain. Pt found to have closed L hip fx & underwent IM nailing of L femur by Dr. Allena Katz on 02/20/20. Of note, pt with hx of existing DVT no longer on anticoagulants as of Dec. 2021 in hopes of avoiding recurrent GI bleed (pt had one Dec 2021). PMH: HTN, HLD, DM, stroke, GERD, depression, a-fibs, GI bleeding, vascular dementia, DVT, chronic diastolic heart failure, renal disorder   Clinical Impression   Ms Fronek was seen for OT evaluation this date. Prior to hospital admission, pt was at [facility]. Pt presents to acute OT demonstrating impaired ADL performance and functional mobility 2/2 pain, functional strength/ROM deficits, and poor insight into deficits. Pt currently requires TOTAL A don B socks at bed level - pt screaming in pain at touch and LLE notably tight. SETUP self-drinking at bed level. Initiated L rolling for toileting however pt unable to achieve c MAX A 2/2 pain. RN in room to assess. Pt would benefit from skilled OT to address noted impairments and functional limitations (see below for any additional details) in order to maximize safety and independence while minimizing falls risk and caregiver burden. Upon hospital discharge, recommend STR to maximize pt safety and return to PLOF.     Follow Up Recommendations  SNF    Equipment Recommendations  Other (comment) (TBD)    Recommendations for Other Services       Precautions / Restrictions Precautions Precautions: Fall Restrictions Weight Bearing Restrictions: Yes LLE Weight Bearing: Weight bearing as tolerated      Mobility Bed Mobility Overal bed mobility: Needs Assistance             General bed mobility comments: attempted rolling, unable to achieve c MAX A    Transfers                  General transfer comment: unable to trial        ADL either performed or assessed with clinical judgement   ADL Overall ADL's : Needs assistance/impaired                                       General ADL Comments: TOTAL A don B socks at bed level - pt screaming in pain at touch and LLE notably tight. SETUP self-drinking at bed level. Initiated L rolling for toileting however pt unable to achieve c MAX A 2/2 pain                  Pertinent Vitals/Pain Pain Assessment: Faces Faces Pain Scale: Hurts worst Pain Location: R + L hip Pain Descriptors / Indicators: Crying;Grimacing;Moaning;Operative site guarding Pain Intervention(s): Limited activity within patient's tolerance;Monitored during session;Premedicated before session;Repositioned     Hand Dominance Right   Extremity/Trunk Assessment Upper Extremity Assessment Upper Extremity Assessment: Generalized weakness   Lower Extremity Assessment Lower Extremity Assessment: RLE deficits/detail;LLE deficits/detail RLE: Unable to fully assess due to pain LLE: Unable to fully assess due to pain       Communication Communication Communication: No difficulties   Cognition Arousal/Alertness: Awake/alert Behavior During Therapy: Agitated Overall Cognitive Status: Within Functional Limits for tasks assessed  General Comments       Exercises Exercises: Other exercises Other Exercises Other Exercises: Pt educated re: OT role, pain mgmt, anxiety mgmt, HEP, importance of mobility   Shoulder Instructions      Home Living Family/patient expects to be discharged to:: Skilled nursing facility Living Arrangements: Spouse/significant other Available Help at Discharge: Family Type of Home: House Home Access: Stairs to enter [employer] of Steps: 5 Entrance Stairs-Rails: Can reach both Home Layout: One level     Bathroom Shower/Tub: Multimedia programmer: Standard Bathroom Accessibility: Yes   Home Equipment: Environmental consultant - 4 wheels;Cane - single point;Grab bars - tub/shower;Shower seat;Grab bars - toilet   Additional Comments: Pt arrived from [facility]      Prior Functioning/Environment Level of Independence: Independent with assistive device(s)                 OT Problem List: Decreased strength;Decreased range of motion;Decreased activity tolerance;Impaired balance (sitting and/or standing);Decreased safety awareness;Pain      OT Treatment/Interventions: Self-care/ADL training;Therapeutic exercise;Energy conservation;DME and/or AE instruction;Therapeutic activities;Balance training;Patient/family education    OT Goals(Current goals can be found in the care plan section) Acute Rehab OT Goals Patient Stated Goal: pain improvement OT Goal Formulation: With patient Time For Goal Achievement: 03/07/20 Potential to Achieve Goals: Fair ADL Goals Pt Will Perform Grooming: with min assist;sitting Pt Will Transfer to Toilet: with mod assist (rolling at bed level)  OT Frequency: Min 1X/week    AM-PAC OT "6 Clicks" Daily Activity     Outcome Measure Help from another person eating meals?: A Little Help from another person taking care of personal grooming?: A Little Help from another person toileting, which includes using toliet, bedpan, or urinal?: Total Help from another person bathing (including washing, rinsing, drying)?: Total Help from another person to put on and taking off regular upper body clothing?: A Little Help from another person to put on and taking off regular lower body clothing?: Total 6 Click Score: 12   End of Session    Activity Tolerance: Patient tolerated treatment well Patient left: in bed;with call bell/phone within reach;with bed alarm set;with nursing/sitter in room  OT Visit Diagnosis: Other abnormalities of gait and mobility (R26.89);Muscle weakness (generalized) (M62.81)                 Time: 5784-6962 OT Time Calculation (min): 15 min Charges:  OT General Charges $OT Visit: 1 Visit OT Evaluation $OT Eval Low Complexity: 1 Low OT Treatments $Self Care/Home Management : 8-22 mins  Kathie Dike, M.S. OTR/L  02/22/20, 4:40 PM  ascom [phone]

## 2020-02-22 NOTE — Progress Notes (Signed)
[facility]  - [place] [facility]   PATIENT NAME: Virginia Mann    MR#:  [id number]  DATE OF BIRTH:  Oct 26, 1940  SUBJECTIVE:  patient came in from [facility] after she had a mechanical fall. Sustained left hip fracture.  Patient complaining of pain all over. When asked where it hurts the most she points/places her hand over the left hip. Gets distracted very easily  REVIEW OF SYSTEMS:   Review of Systems  Constitutional: Negative for chills, fever and weight loss.  HENT: Negative for ear discharge, ear pain and nosebleeds.   Eyes: Negative for blurred vision, pain and discharge.  Respiratory: Negative for sputum production, shortness of breath, wheezing and stridor.   Cardiovascular: Negative for chest pain, palpitations, orthopnea and PND.  Gastrointestinal: Negative for abdominal pain, diarrhea, nausea and vomiting.  Genitourinary: Negative for frequency and urgency.  Musculoskeletal: Positive for back pain. Negative for joint pain.  Neurological: Negative for sensory change, speech change, focal weakness and weakness.  Psychiatric/Behavioral: Negative for depression and hallucinations. The patient is not nervous/anxious.    Tolerating Diet:yesTolerating PT: pending  DRUG ALLERGIES:   Allergies  Allergen Reactions  . Apixaban Itching and Rash    VITALS:  Blood pressure (!) 164/61, pulse 63, temperature 97.7 F (36.5 C), temperature source Oral, resp. rate 20, height 5\' 1"  (1.549 m), weight 64.4 kg, SpO2 99 %.  PHYSICAL EXAMINATION:   Physical Exam  GENERAL:  80 y.o.-year-old patient lying in the bed with no acute distress. fraile LUNGS: Normal breath sounds bilaterally, no wheezing, rales, rhonchi. No use of accessory muscles of respiration.  CARDIOVASCULAR: S1, S2 normal. No murmurs, rubs, or gallops.  ABDOMEN: Soft, nontender, nondistended. Bowel sounds present. No organomegaly or mass.  EXTREMITIES: No cyanosis, clubbing or edema b/l.    Surgical dressing left hip--old blood. No redness NEUROLOGIC: grossly nonfocal. PSYCHIATRIC:  patient is alert and awake SKIN: No obvious rash, lesion, or ulcer.   LABORATORY PANEL:  CBC Recent Labs  Lab 02/22/20 0359  WBC 8.2  HGB 8.3*  HCT 27.1*  PLT 235    Chemistries  Recent Labs  Lab 02/19/20 0355 02/20/20 0539 02/22/20 0359  NA 141   < > 136  K 3.7   < > 4.4  CL 106   < > 103  CO2 23   < > 25  GLUCOSE 160*   < > 139*  BUN 16   < > 37*  CREATININE 0.96   < > 0.94  CALCIUM 8.7*   < > 8.2*  AST 17  --   --   ALT 11  --   --   ALKPHOS 87  --   --   BILITOT 0.5  --   --    < > = values in this interval not displayed.   Cardiac Enzymes No results for input(s): TROPONINI in the last 168 hours. RADIOLOGY:  No results found. ASSESSMENT AND PLAN:  Ronell Boldin is a 80 y.o. female with medical history significant of hypertension, hyperlipidemia, diabetes mellitus, stroke, GERD, depression, atrial fibrillation on anticoagulants, GI bleeding, vascular dementia, DVT, who presents with fall and left hip pain.  Closed left hip fracture: As evidenced by x-ray. Patient has severe pain now. No neurovascular compromise. -- Orthopedic surgeon, Dr. 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    -- per patient's daughter-- not a candidate for anticoagulation due to anemia and G.I. bleed -- ultrasound bilateral lower extremity ordered by ortho  Diabetes mellitus without complication (HCC): No A1c on record.   --Patient taking Metformin.  Patient daughter states that patient was very sensitive to insulin,  easily develop hypoglycemia with insulin treatment -no SSI per dter's request  HTN (hypertension) -IV hydralazine as needed -Lotensin  Stroke (HCC) -Lipitor  Chronic a-fib (HCC): Stopped taking Xarelto due to GI bleeding.   -Heart rate is stable --seen by Dr Lady Gary  Chronic diastolic (congestive) heart failure [facility]): 2D echo 11/08/19 showed EF> 50%.  Patient does not have leg edema or JVD.  CHF seem to be compensated.  Thyroid nodule: CT of the C-spine incidentally showed thyroid nodule -Follow-up with PCP  Nutrition Status: Nutrition Problem: Increased nutrient needs Etiology: post-op healing,hip fracture Signs/Symptoms: estimated needs Interventions: Premier Protein,MVI,Juven   DVT ppx: SCD Code Status: DNR prior to admission Family Communication:  Yes, patient's daughter Rayfield Citizen by phone 2/15 Disposition Plan: pending PT eval Consults called:  Dr. Allena Katz of ortho and Dr. Lady Gary of card are consulted. Admission status and Level of care: Med-Surg:   as inpt      Status is: Inpatient  Remains inpatient appropriate because:Inpatient level of care appropriate due to severity of illness   Dispo: The patient is from: SNF  Anticipated d/c is to: SNF [facility]  Anticipated d/c date is:1-2 days  Patient currently is not medically stable to d/c.              Difficult to place patient No   Level of care: Med-Surg          TOTAL TIME TAKING CARE OF THIS PATIENT: *25* minutes.  >50% time spent on counselling and coordination of care  Note: This dictation was prepared with Dragon dictation along with smaller phrase technology. Any transcriptional errors that result from this process are unintentional.  Enedina Finner M.D    [facility]   CC: Primary care physician; Lynwood Dawley, MDPatient ID: [name], female   DOB: [birth year], 80 y.o.   MRN: [id number]

## 2020-02-22 NOTE — Progress Notes (Signed)
PT Cancellation Note  Patient Details Name: Virginia Mann MRN: [id number] DOB: Oct 17, 1940   Cancelled Treatment:    Reason Eval/Treat Not Completed: Medical issues which prohibited therapy.  Chart reviewed pt and attempted to see.  Pt demonstrated positive Homan's sign, which was communicated with attending and ortho PA.  Orders for ultrasound to rule out DVT were placed.  PT will attempt to be seen following results and as medically appropriate.     Nolon Bussing, PT, DPT 02/22/20, 11:44 AM

## 2020-02-22 NOTE — Progress Notes (Signed)
OT Cancellation Note  Patient Details Name: Virginia Mann MRN: [id number] DOB: 1940-10-27   Cancelled Treatment:    Reason Eval/Treat Not Completed: Patient at procedure or test/ unavailable. Per chart review, pt at [place] to r/o DVT. Will follow up at later date/time as able to initiate services.   Kathie Dike, M.S. OTR/L  02/22/20, 11:44 AM  ascom [phone]

## 2020-02-22 NOTE — Progress Notes (Signed)
  Subjective: 2 Days Post-Op Procedure(s) (LRB): INTRAMEDULLARY (IM) NAIL INTERTROCHANTRIC (Left) Patient reports pain as moderate. Mostly in the left calf. Physical therapy concerned about increased swelling and warmth in the left lower leg. Patient also complaining of similar pain in the right calf. Patient is well, and has had no acute complaints or problems Plan is to go Skilled nursing facility after hospital stay. Negative for chest pain and shortness of breath Fever: no Gastrointestinal: Negative for nausea and vomiting  Objective: Vital signs in last 24 hours: Temp:  [97.6 F (36.4 C)-98.3 F (36.8 C)] 97.7 F (36.5 C) (02/15 0815) Pulse Rate:  [58-80] 63 (02/15 0815) Resp:  [15-20] 20 (02/15 0815) BP: (127-184)/(42-64) 164/61 (02/15 0815) SpO2:  [96 %-100 %] 99 % (02/15 0815)  Intake/Output from previous day:  Intake/Output Summary (Last 24 hours) at 02/22/2020 1035 Last data filed at 02/22/2020 0500 Gross per 24 hour  Intake 412.92 ml  Output 200 ml  Net 212.92 ml    Intake/Output this shift: No intake/output data recorded.  Labs: Recent Labs    02/20/20 0539 02/21/20 0432 02/21/20 1635 02/22/20 0359  HGB 9.0* 6.8* 8.3* 8.3*   Recent Labs    02/21/20 0432 02/21/20 1635 02/22/20 0359  WBC 6.9  --  8.2  RBC 2.53*  --  3.14*  HCT 21.9* 26.7* 27.1*  PLT 221  --  235   Recent Labs    02/21/20 0432 02/22/20 0359  NA 136 136  K 4.2 4.4  CL 102 103  CO2 24 25  BUN 27* 37*  CREATININE 0.98 0.94  GLUCOSE 152* 139*  CALCIUM 8.3* 8.2*   No results for input(s): LABPT, INR in the last 72 hours.   EXAM General - Patient is Alert and Oriented Bilateral lower extremities- Neurovascular intact Sensation intact distally Dorsiflexion/Plantar flexion intact Compartment soft  Positive Homans' sign bilaterally, left More severe than right Dressing/Incision - clean, dry, scant drainage Motor Function - intact, moving foot and toes well on exam.   Past  Medical History:  Diagnosis Date  . Chronic a-fib (HCC)   . Chronic diastolic (congestive) heart failure (HCC)   . Diabetes mellitus without complication (HCC)   . DVT (deep venous thrombosis) (HCC)   . HTN (hypertension)   . Renal disorder   . Stroke [facility])     Assessment/Plan: 2 Days Post-Op Procedure(s) (LRB): INTRAMEDULLARY (IM) NAIL INTERTROCHANTRIC (Left) Principal Problem:   Closed left hip fracture (HCC) Active Problems:   Diabetes mellitus without complication (HCC)   Normocytic anemia   HTN (hypertension)   Stroke (HCC)   Chronic a-fib (HCC)   Chronic diastolic (congestive) heart failure (HCC)   Fall   Thyroid nodule   Dementia without behavioral disturbance (HCC)  Estimated body mass index is 26.83 kg/m as calculated from the following:   Height as of this encounter: 5\' 1"  (1.549 m).   Weight as of this encounter: 64.4 kg. Advance diet Up with therapy  Acute blood loss anemia hemoglobin 8.3, stable. Status post 1 unit of packed red blood cells. Vital signs are stable Pain well controlled Stat ultrasound bilateral lower extremities, patient with known DVT in the left mid and peripheral femoral vein that has been stable.  Follow-up at [facility] clinic orthopedics in 2 weeks for staple removal and left femur x-rays.  DVT Prophylaxis - TED hose and SCDs Weight-Bearing as tolerated to left leg  [facility], PA-C Orthopaedic Surgery 02/22/2020, 10:35 AM

## 2020-02-22 NOTE — Evaluation (Signed)
Physical Therapy Evaluation Patient Details Name: Virginia Mann MRN: [id number] DOB: January 15, 1940 Today's Date: 02/22/2020   History of Present Illness  Pt admitted on 02/19/20 from SNF with c/c of fall & L hip pain. Pt found to have closed L hip fx & underwent IM nailing of L femur by Dr. Allena Katz on 02/20/20. Of note, pt with hx of existing DVT no longer on anticoagulants as of Dec. 2021 in hopes of avoiding recurrent GI bleed (pt had one Dec 2021). PMH: HTN, HLD, DM, stroke, GERD, depression, a-fibs, GI bleeding, vascular dementia, DVT, chronic diastolic heart failure, renal disorder     Clinical Impression  Pt received in supine position upon arrival and agreeable to therapy.  Pt was able to perform bed level exercises with some assistance from therapist.  Pt describes discomfort throughout the session, but if she is distracted by something in the room or conversation, pt is able to perform the exercises.  Pt does require redirection throughout the session, especially when attempting to perform bed mobility.  Pt was given pain medication prior to therapy and still reprots excruciating pain that causes her to yell when moving the L LE.  Pt will be reassessed at later date for ambulation ability when pt's pain is better controlled.  Pt will benefit from skilled PT intervention to increase independence and safety with basic mobility in preparation for discharge to the venue listed below.       Follow Up Recommendations SNF;Supervision/Assistance - 24 hour    Equipment Recommendations  Rolling walker with 5" wheels    Recommendations for Other Services       Precautions / Restrictions Precautions Precautions: Fall Restrictions Weight Bearing Restrictions: Yes LLE Weight Bearing: Weight bearing as tolerated      Mobility  Bed Mobility Overal bed mobility: Needs Assistance             General bed mobility comments: pt unable to perform rolling or transfer to EOB due to pain levels.   given maximal encouragement to perform over to the EOB.    Transfers                 General transfer comment: unable to trial  Ambulation/Gait                Stairs            Wheelchair Mobility    Modified Rankin (Stroke Patients Only)       Balance                                             Pertinent Vitals/Pain Pain Assessment: Faces Faces Pain Scale: Hurts worst Pain Location: R + L hip Pain Descriptors / Indicators: Crying;Grimacing;Moaning;Operative site guarding Pain Intervention(s): Limited activity within patient's tolerance;Monitored during session;Premedicated before session;Repositioned    Home Living Family/patient expects to be discharged to:: Skilled nursing facility Living Arrangements: Spouse/significant other Available Help at Discharge: Family Type of Home: House Home Access: Stairs to enter Entrance Stairs-Rails: Can reach both Entrance Stairs-Number of Steps: 5 Home Layout: One level Home Equipment: Walker - 4 wheels;Cane - single point;Grab bars - tub/shower;Shower seat;Grab bars - toilet Additional Comments: Pt arrived from [facility]    Prior Function Level of Independence: Independent with assistive device(s)               Hand Dominance  Dominant Hand: Right    Extremity/Trunk Assessment   Upper Extremity Assessment Upper Extremity Assessment: Generalized weakness    Lower Extremity Assessment Lower Extremity Assessment: RLE deficits/detail;LLE deficits/detail RLE: Unable to fully assess due to pain LLE: Unable to fully assess due to pain       Communication   Communication: No difficulties  Cognition Arousal/Alertness: Awake/alert Behavior During Therapy: Agitated Overall Cognitive Status: Within Functional Limits for tasks assessed                                        General Comments General comments (skin integrity, edema, etc.): Unable to perform  due to pain.    Exercises Total Joint Exercises Ankle Circles/Pumps: AROM;Strengthening;Both;10 reps;Supine Quad Sets: AROM;Strengthening;Both;10 reps;Supine Gluteal Sets: AROM;Strengthening;Both;10 reps;Supine Hip ABduction/ADduction: AROM;Strengthening;Both;10 reps;Supine Straight Leg Raises: AAROM;Strengthening;Both;10 reps;Supine Other Exercises Other Exercises: Pt educated on role of PT and services provided during hospital stay. Other Exercises: Pt educated on physiological benefits of exercise and encouraged to perform in between bouts of therapy sessions in order to decrease pain and increase strength.   Assessment/Plan    PT Assessment Patient needs continued PT services  PT Problem List Decreased strength;Decreased range of motion;Decreased activity tolerance;Decreased mobility;Decreased cognition;Decreased safety awareness;Pain       PT Treatment Interventions DME instruction;Gait training;Stair training;Functional mobility training;Therapeutic activities;Therapeutic exercise;Balance training;Neuromuscular re-education    PT Goals (Current goals can be found in the Care Plan section)  Acute Rehab PT Goals Patient Stated Goal: pain improvement PT Goal Formulation: With patient Time For Goal Achievement: 03/07/20 Potential to Achieve Goals: Fair    Frequency 7X/week   Barriers to discharge Decreased caregiver support      Co-evaluation               AM-PAC PT "6 Clicks" Mobility  Outcome Measure Help needed turning from your back to your side while in a flat bed without using bedrails?: A Lot Help needed moving from lying on your back to sitting on the side of a flat bed without using bedrails?: A Lot Help needed moving to and from a bed to a chair (including a wheelchair)?: A Lot Help needed standing up from a chair using your arms (e.g., wheelchair or bedside chair)?: A Lot Help needed to walk in hospital room?: Total Help needed climbing 3-5 steps with a  railing? : Total 6 Click Score: 10    End of Session   Activity Tolerance: Patient limited by pain Patient left: in bed;with call bell/phone within reach;with bed alarm set Nurse Communication: Mobility status PT Visit Diagnosis: Unsteadiness on feet (R26.81);Other abnormalities of gait and mobility (R26.89);Repeated falls (R29.6);Muscle weakness (generalized) (M62.81);History of falling (Z91.81);Difficulty in walking, not elsewhere classified (R26.2);Pain Pain - Right/Left:  (Bilateral) Pain - part of body: Hip    Time: 1412-1440 PT Time Calculation (min) (ACUTE ONLY): 28 min   Charges:   PT Evaluation $PT Eval Low Complexity: 1 Low PT Treatments $Therapeutic Exercise: 23-37 mins        Nolon Bussing, PT, DPT 02/22/20, 5:19 PM

## 2020-02-22 NOTE — NC FL2 (Signed)
[place] MEDICAID FL2 LEVEL OF CARE SCREENING TOOL     IDENTIFICATION  Patient Name: Virginia Mann Birthdate: 1940-04-10 Sex: female Admission Date (Current Location): 02/19/2020  [place] Number:  Chiropodist and Address:  [facility], [address] [place], [place] [address]      Provider Number: [id number]  Attending Physician Name and Address:  Enedina Finner, MD  Relative Name and Phone Number:  Chelsye Suhre [phone]    Current Level of Care: Hospital Recommended Level of Care: Skilled Nursing Facility Prior Approval Number:    Date Approved/Denied:   PASRR Number: [id number]  Discharge Plan: SNF    Current Diagnoses: Patient Active Problem List   Diagnosis Date Noted  . Dementia without behavioral disturbance (HCC)   . Closed left hip fracture (HCC) 02/19/2020  . Diabetes mellitus without complication (HCC)   . Normocytic anemia   . HTN (hypertension)   . Stroke (HCC)   . Chronic a-fib (HCC)   . Chronic diastolic (congestive) heart failure (HCC)   . Fall   . Thyroid nodule     Orientation RESPIRATION BLADDER Height & Weight     Self,Time,Situation,Place  Normal External catheter Weight: 64.4 kg Height:  5\' 1"  (154.9 cm)  BEHAVIORAL SYMPTOMS/MOOD NEUROLOGICAL BOWEL NUTRITION STATUS      Continent Diet (Regular)  AMBULATORY STATUS COMMUNICATION OF NEEDS Skin   Extensive Assist Verbally Surgical wounds                       Personal Care Assistance Level of Assistance  Bathing,Feeding,Dressing Bathing Assistance: Maximum assistance Feeding assistance: Limited assistance Dressing Assistance: Maximum assistance     Functional Limitations Info             SPECIAL CARE FACTORS FREQUENCY  PT (By licensed PT),OT (By licensed OT)                    Contractures Contractures Info: Not present    Additional Factors Info  Code Status,Allergies Code Status Info: DNR Allergies Info:  Apixaban           Current Medications (02/22/2020):  This is the current hospital active medication list Current Facility-Administered Medications  Medication Dose Route Frequency Provider Last Rate Last Admin  . acetaminophen (TYLENOL) tablet 1,000 mg  1,000 mg Oral Q8H 02/24/2020, MD   1,000 mg at 02/22/20 02/24/20  . atorvastatin (LIPITOR) tablet 40 mg  40 mg Oral QHS 9379, MD   40 mg at 02/21/20 2250  . benazepril (LOTENSIN) tablet 40 mg  40 mg Oral Daily 02/23/20, MD   40 mg at 02/21/20 0959  . bisacodyl (DULCOLAX) suppository 10 mg  10 mg Rectal Daily PRN 02/23/20, MD      . cholecalciferol (VITAMIN D3) tablet 2,000 Units  2,000 Units Oral Daily Signa Kell, MD   2,000 Units at 02/21/20 0957  . docusate sodium (COLACE) capsule 100 mg  100 mg Oral BID 02/23/20, MD   100 mg at 02/21/20 2250  . donepezil (ARICEPT) tablet 10 mg  10 mg Oral QHS 02/23/20, MD   10 mg at 02/21/20 2250  . hydrALAZINE (APRESOLINE) injection 5 mg  5 mg Intravenous Q2H PRN 02/23/20, MD      . HYDROmorphone (DILAUDID) injection 0.25-0.5 mg  0.25-0.5 mg Intravenous Q2H PRN Signa Kell, MD   0.5 mg at 02/21/20 1215  . metFORMIN (GLUCOPHAGE-XR) 24 hr tablet 500  mg  500 mg Oral Q breakfast Signa Kell, MD   500 mg at 02/22/20 0731  . methocarbamol (ROBAXIN) tablet 500 mg  500 mg Oral Q8H PRN Signa Kell, MD   500 mg at 02/22/20 7124  . metoCLOPramide (REGLAN) tablet 5-10 mg  5-10 mg Oral Q8H PRN Signa Kell, MD       Or  . metoCLOPramide (REGLAN) injection 5-10 mg  5-10 mg Intravenous Q8H PRN Signa Kell, MD      . multivitamin with minerals tablet 1 tablet  1 tablet Oral Daily Signa Kell, MD   1 tablet at 02/21/20 0957  . nutrition supplement (JUVEN) (JUVEN) powder packet 1 packet  1 packet Oral BID BM Signa Kell, MD   1 packet at 02/21/20 1305  . ondansetron (ZOFRAN) tablet 4 mg  4 mg Oral Q6H PRN Signa Kell, MD   4 mg at 02/21/20 1355   Or  . ondansetron (ZOFRAN) injection 4  mg  4 mg Intravenous Q6H PRN Signa Kell, MD      . oxyCODONE (Oxy IR/ROXICODONE) immediate release tablet 5 mg  5 mg Oral Q4H PRN Enedina Finner, MD      . pantoprazole (PROTONIX) EC tablet 40 mg  40 mg Oral Daily Signa Kell, MD   40 mg at 02/21/20 0957  . protein supplement (ENSURE MAX) liquid  11 oz Oral Daily Signa Kell, MD   11 oz at 02/21/20 1004  . senna-docusate (Senokot-S) tablet 1 tablet  1 tablet Oral QHS PRN Signa Kell, MD      . sertraline (ZOLOFT) tablet 50 mg  50 mg Oral Daily Signa Kell, MD   50 mg at 02/21/20 0957  . sodium phosphate (FLEET) 7-19 GM/118ML enema 1 enema  1 enema Rectal Once PRN Signa Kell, MD      . traMADol Janean Sark) tablet 50 mg  50 mg Oral Q6H PRN Signa Kell, MD         Discharge Medications: Please see discharge summary for a list of discharge medications.  Relevant Imaging Results:  Relevant Lab Results:   Additional Information SS# [id number]  Trenton Founds, RN

## 2020-02-22 NOTE — Care Management Important Message (Signed)
Important Message  Patient Details  Name: Virginia Mann MRN: [id number] Date of Birth: 10-10-1940   Medicare Important Message Given:  Yes     Johnell Comings 02/22/2020, 12:03 PM

## 2020-02-23 DIAGNOSIS — G309 Alzheimer's disease, unspecified: Secondary | ICD-10-CM

## 2020-02-23 DIAGNOSIS — F0280 Dementia in other diseases classified elsewhere without behavioral disturbance: Secondary | ICD-10-CM

## 2020-02-23 DIAGNOSIS — I482 Chronic atrial fibrillation, unspecified: Secondary | ICD-10-CM | POA: Diagnosis not present

## 2020-02-23 DIAGNOSIS — I5032 Chronic diastolic (congestive) heart failure: Secondary | ICD-10-CM | POA: Diagnosis not present

## 2020-02-23 DIAGNOSIS — S72002A Fracture of unspecified part of neck of left femur, initial encounter for closed fracture: Secondary | ICD-10-CM | POA: Diagnosis not present

## 2020-02-23 LAB — RESP PANEL BY RT-PCR (FLU A&B, COVID) ARPGX2
Influenza A by PCR: NEGATIVE
Influenza B by PCR: NEGATIVE
SARS Coronavirus 2 by RT PCR: NEGATIVE

## 2020-02-23 LAB — HEMOGLOBIN: Hemoglobin: 8.8 g/dL — ABNORMAL LOW (ref 12.0–15.0)

## 2020-02-23 LAB — GLUCOSE, CAPILLARY: Glucose-Capillary: 132 mg/dL — ABNORMAL HIGH (ref 70–99)

## 2020-02-23 MED ORDER — METHOCARBAMOL 500 MG PO TABS: 500.0000 mg | ORAL_TABLET | Freq: Three times a day (TID) | ORAL | Status: AC | PRN

## 2020-02-23 MED ORDER — ENSURE MAX PROTEIN PO LIQD: 11.0000 [oz_av] | Freq: Every day | ORAL | Status: AC

## 2020-02-23 MED ORDER — POLYETHYLENE GLYCOL 3350 17 G PO PACK: 17.0000 g | PACK | Freq: Every day | ORAL | 0 refills | Status: AC

## 2020-02-23 MED ORDER — ADULT MULTIVITAMIN W/MINERALS CH: 1.0000 | ORAL_TABLET | Freq: Every day | ORAL | Status: AC

## 2020-02-23 MED ORDER — BISACODYL 10 MG RE SUPP: 10.0000 mg | Freq: Every day | RECTAL | 0 refills | Status: AC | PRN

## 2020-02-23 MED ORDER — JUVEN PO PACK: 1.0000 | PACK | Freq: Two times a day (BID) | ORAL | 0 refills | Status: AC

## 2020-02-23 MED ORDER — DOCUSATE SODIUM 100 MG PO CAPS: 100.0000 mg | ORAL_CAPSULE | Freq: Two times a day (BID) | ORAL | 0 refills | Status: AC

## 2020-02-23 MED ORDER — ACETAMINOPHEN 500 MG PO TABS: 1000.0000 mg | ORAL_TABLET | Freq: Three times a day (TID) | ORAL | 0 refills | Status: AC

## 2020-02-23 NOTE — Progress Notes (Signed)
Physical Therapy Treatment Patient Details Name: Virginia Mann MRN: [id number] DOB: 1940/06/07 Today's Date: 02/23/2020    History of Present Illness Pt admitted on 02/19/20 from SN[facility] with c/c of fall & L hip pain. Pt found to have closed L hip fx & underwent IM nailing of L femur by Dr. Allena Katz on 02/20/20. Of note, pt with hx of existing DVT no longer on anticoagulants as of Dec. 2021 in hopes of avoiding recurrent GI bleed (pt had one Dec 2021). PMH: HTN, HLD, DM, stroke, GERD, depression, a-fibs, GI bleeding, vascular dementia, DVT, chronic diastolic heart failure, renal disorder    PT Comments    Pt pre-medicated prior to session.  Pt received supine in bed, pleasant, and agreed to PT.  Pt stated she still had 10/10 pain in L Hip with slightest movement and continued to yell out throughout session.  Pt given several short rest breaks in which she quickly fell asleep.  Pt required MaxA of 2 to transfer to EOB due to resisting mobility.  Stand/squat pivot with MaxA of 2 again due to pt resisting and not following commands.  Pt reclined in chair, no c/o pain, chair alarm set and in place.  Pt with poor tolerance for mobility due to fixation on L LE despite Nursing pre-medicating pt.  Once sitting in chair pt fell asleep.  Pt will benefit from SNF placement once medically stable.    Follow Up Recommendations  SNF;Supervision/Assistance - 24 hour     Equipment Recommendations       Recommendations for Other Services       Precautions / Restrictions Precautions Precautions: Fall Restrictions Weight Bearing Restrictions: Yes LLE Weight Bearing: Weight bearing as tolerated    Mobility  Bed Mobility Overal bed mobility: Needs Assistance Bed Mobility: Sidelying to Sit   Sidelying to sit: +2 for physical assistance (increased assist due to pt resisting mobility)            Transfers Overall transfer level: Needs assistance Equipment used:  (2 person assist) Transfers: Squat  Pivot Transfers     Squat pivot transfers: +2 physical assistance        Ambulation/Gait             General Gait Details:  (Unable to attempt ambulation due to poor tolerance for any activity/movement)   Stairs             Wheelchair Mobility    Modified Rankin (Stroke Patients Only)       Balance                                            Cognition Arousal/Alertness: Awake/alert Behavior During Therapy: Anxious Overall Cognitive Status: Within Functional Limits for tasks assessed                                        Exercises      General Comments General comments (skin integrity, edema, etc.): Unable to perform due to pain, guarding of L LE      Pertinent Vitals/Pain Pain Assessment: 0-10 Pain Score: 10-Worst pain ever Pain Location:  (L Hip) Pain Descriptors / Indicators: Crying;Grimacing;Moaning;Operative site guarding Pain Intervention(s): Monitored during session;Premedicated before session (Pt fell asleep during rest periods despite 10/10 pain)    Home Living  Prior Function            PT Goals (current goals can now be found in the care plan section) Acute Rehab PT Goals Patient Stated Goal: pain improvement    Frequency    7X/week      PT Plan      Co-evaluation              AM-PAC PT "6 Clicks" Mobility   Outcome Measure  Help needed turning from your back to your side while in a flat bed without using bedrails?: A Lot Help needed moving from lying on your back to sitting on the side of a flat bed without using bedrails?: A Lot Help needed moving to and from a bed to a chair (including a wheelchair)?: A Lot Help needed standing up from a chair using your arms (e.g., wheelchair or bedside chair)?: A Lot Help needed to walk in hospital room?: Total Help needed climbing 3-5 steps with a railing? : Total 6 Click Score: 10    End of Session Equipment  Utilized During Treatment: Gait belt Activity Tolerance: Patient limited by pain Patient left: in chair;with call bell/phone within reach;with chair alarm set Nurse Communication: Mobility status PT Visit Diagnosis: Unsteadiness on feet (R26.81);Other abnormalities of gait and mobility (R26.89);Repeated falls (R29.6);Muscle weakness (generalized) (M62.81);History of falling (Z91.81);Difficulty in walking, not elsewhere classified (R26.2);Pain Pain - Right/Left: Left Pain - part of body: Hip     Time: 6578-4696 PT Time Calculation (min) (ACUTE ONLY): 48 min  Charges:  $Therapeutic Activity: 38-52 mins                     Zadie Cleverly, PTA   Jannet Askew 02/23/2020, 12:31 PM

## 2020-02-23 NOTE — Discharge Summary (Signed)
[name] FWY:[id number] DOB: 1940/08/25 DOA: 02/19/2020  PCP: Lynwood Dawley, MD  Admit date: 02/19/2020 Discharge date: 02/23/2020  Admitted From: SNF Disposition:  SNF  Recommendations for Outpatient Follow-up:  1. Follow up with PCP in 1 week 2. Please obtain BMP/CBC in one week 3. Orthopedics in 2 week      Discharge Condition:Stable CODE STATUS:DNR  Diet recommendation: Regular  Brief/Interim Summary: Per HPI: Virginia Mann is a 80 y.o. female with medical history significant of hypertension, hyperlipidemia, diabetes mellitus, stroke, GERD, depression, atrial fibrillation on anticoagulants, GI bleeding, vascular dementia, DVT, who presents with fall and left hip pain.  Pt stated that she accidentally slipped and fell in facility, injured her left hip, causing severe pain.   Patient denied loss of consciousness.  No head or neck injury.  ED Course: pt was found to have WBC 6.8, hemoglobin 9.3 (10.5 on 01/20/2020), negative Covid PCR, electrolytes renal function okay, temperature normal, blood pressure 132/61, heart rate is 74, RR 22, 13, oxygen saturation 95% on room air.  Chest x-ray showed a small opacity in left basilar area.  X-ray of left hip showed comminuted left intertrochanteric fracture with displacement, overriding of fragments. CT-head negative for acute intracranial abnormalities CT of C-spine negative for fracture.  Patient is admitted to MedSurg bed as inpatient. Dr. Allena Katz of ortho and Dr. Lady Gary of card are consulted.   Closed left hip fracture:As evidenced by x-ray. No neurovascular compromise. -- Orthopedic surgeon, Dr. Allena Katz consult appreciated -POD 3 s/p  left hip intramedullary nailing of left femur -Pain control - Robaxin for muscle spasm  bowel regimen Follow-up at [facility] orthopedics in 2 weeks for staple removal and left femur x-rays. DVT Prophylaxis - TED hose and SCDs Weight-Bearing as tolerated to left  leg   Normocytic anemia Post-op anemia --hgb 10.3--9.0--6.8--s/p 1 Unit PRBC transfused, h/h stable.   H/o left LE DVT on may 2021  05/2019 [place] LE --Nonocclusive thrombus in the left mid and peripheral femoral vein.   -- per patient's daughter-- not a candidate for anticoagulation due to anemia and G.I. bleed -- ultrasound bilateral lower extremity on 02/22/20-No evidence of bilateral lower extremity deep vein thrombosis.  Diabetes mellitus without complication [facility]): --Patient taking Metformin. Patient daughter states that patient was very sensitive to insulin, easily develop hypoglycemia with insulin treatment -no SSI per dter's request  HTN (hypertension) Continue ACEI  Stroke [facility]) -continue Lipitor  Chronic a-fib (HCC):Stopped taking Xarelto due to GI bleeding. -Heart rate is stable --seen by Dr Lady Gary  Chronic diastolic (congestive) heart failure (HCC):2D echo 11/08/19 showed EF>50%. Compensated.   Thyroid nodule: CT of the C-spine incidentally showed thyroid nodule -Follow-up with PCP  Nutrition Status: Nutrition Problem: Increased nutrient needs Etiology: post-op healing,hip fracture Signs/Symptoms: estimated needs Interventions: Premier Protein,MVI,Juven    Discharge Diagnoses:  Principal Problem:   Closed left hip fracture (HCC) Active Problems:   Diabetes mellitus without complication (HCC)   Normocytic anemia   HTN (hypertension)   Stroke (HCC)   Chronic a-fib (HCC)   Chronic diastolic (congestive) heart failure (HCC)   Fall   Thyroid nodule   Dementia without behavioral disturbance (HCC)    Discharge Instructions  Discharge Instructions    Diet - low sodium heart healthy   Complete by: As directed    Increase activity slowly   Complete by: As directed    Leave dressing on - Keep it clean, dry, and intact until clinic visit   Complete by: As directed  Allergies as of 02/23/2020      Reactions   Apixaban Itching, Rash       Medication List    STOP taking these medications   Xarelto 15 MG Tabs tablet Generic drug: Rivaroxaban     TAKE these medications   acetaminophen 500 MG tablet Commonly known as: TYLENOL Take 2 tablets (1,000 mg total) by mouth every 8 (eight) hours. What changed:   how much to take  when to take this  reasons to take this   atorvastatin 40 MG tablet Commonly known as: LIPITOR Take 40 mg by mouth at bedtime.   benazepril 40 MG tablet Commonly known as: LOTENSIN Take 40 mg by mouth daily.   bisacodyl 10 MG suppository Commonly known as: DULCOLAX Place 1 suppository (10 mg total) rectally daily as needed for moderate constipation.   Cholecalciferol 25 MCG (1000 UT) capsule Take 2,000 Units by mouth daily.   cyanocobalamin 1000 MCG tablet Take 1,000 mcg by mouth daily.   docusate sodium 100 MG capsule Commonly known as: COLACE Take 1 capsule (100 mg total) by mouth 2 (two) times daily.   donepezil 10 MG tablet Commonly known as: ARICEPT Take 10 mg by mouth at bedtime.   enoxaparin 40 MG/0.4ML injection Commonly known as: LOVENOX Inject 0.4 mLs (40 mg total) into the skin daily for 14 doses.   Ensure Max Protein Liqd Take 330 mLs (11 oz total) by mouth daily. Start taking on: February 24, 2020   nutrition supplement (JUVEN) Pack Take 1 packet by mouth 2 (two) times daily between meals. Start taking on: February 24, 2020   metFORMIN 500 MG 24 hr tablet Commonly known as: GLUCOPHAGE-XR Take 500 mg by mouth daily with breakfast.   methocarbamol 500 MG tablet Commonly known as: ROBAXIN Take 1 tablet (500 mg total) by mouth every 8 (eight) hours as needed for muscle spasms.   multivitamin with minerals Tabs tablet Take 1 tablet by mouth daily. Start taking on: February 24, 2020   omeprazole 40 MG capsule Commonly known as: PRILOSEC Take 40 mg by mouth daily before breakfast.   oxyCODONE 5 MG immediate release tablet Commonly known as: Oxy  IR/ROXICODONE Take 0.5-1 tablets (2.5-5 mg total) by mouth every 3 (three) hours as needed for moderate pain or severe pain (pain score 4-6).   polyethylene glycol 17 g packet Commonly known as: MIRALAX / GLYCOLAX Take 17 g by mouth daily. Start taking on: February 24, 2020   senna-docusate 8.6-50 MG tablet Commonly known as: Senokot-S Take 1 tablet by mouth 2 (two) times daily.   sertraline 50 MG tablet Commonly known as: ZOLOFT Take 50 mg by mouth daily.   traMADol 50 MG tablet Commonly known as: ULTRAM Take 1 tablet (50 mg total) by mouth every 6 (six) hours as needed for moderate pain. What changed:   how much to take  when to take this            Discharge Care Instructions  (From admission, onward)         Start     Ordered   02/23/20 0000  Leave dressing on - Keep it clean, dry, and intact until clinic visit        02/23/20 1351          Follow-up Information    Dedra Skeens, PA-C Follow up in 2 week(s).   Specialty: Orthopedic Surgery Why: For staple removal and left femur x-rays Contact information: [address] [facility]  [place] [address] [phone]              Allergies  Allergen Reactions  . Apixaban Itching and Rash    Consultations:  orthopedics   Procedures/Studies: DG Chest 1 View  Result Date: 02/19/2020 CLINICAL DATA:  80 year old female status post unwitnessed fall. Left femur fracture. EXAM: CHEST  1 VIEW COMPARISON:  None. FINDINGS: Portable AP semi upright view at 0356 hours. Normal lung volumes. Cardiac size at the upper limits of normal. Other mediastinal contours are within normal limits. Visualized tracheal air column is within normal limits. No pneumothorax, pulmonary edema or consolidation. No definite pleural effusion or pulmonary edema. Streaky left lung base opacity near the cardiac apex might be atelectasis or scarring. Paucity of bowel gas in the upper abdomen. No acute osseous abnormality  identified. IMPRESSION: 1. Small area of opacity at the left lung base is nonspecific. Atelectasis or scarring favored over infection or aspiration. 2. No other acute cardiopulmonary abnormality. Electronically Signed   By: Odessa Fleming M.D.   On: 02/19/2020 04:33   CT Head Wo Contrast  Result Date: 02/19/2020 CLINICAL DATA:  Unwitnessed fall. EXAM: CT HEAD WITHOUT CONTRAST CT CERVICAL SPINE WITHOUT CONTRAST TECHNIQUE: Multidetector CT imaging of the head and cervical spine was performed following the standard protocol without intravenous contrast. Multiplanar CT image reconstructions of the cervical spine were also generated. COMPARISON:  None. FINDINGS: CT HEAD FINDINGS Brain: Cerebral ventricle sizes are concordant with the degree of cerebral volume loss. Chronic right occipital lobe infarction. Patchy and confluent areas of decreased attenuation are noted throughout the deep and periventricular white matter of the cerebral hemispheres bilaterally, compatible with chronic microvascular ischemic disease. No evidence of large-territorial acute infarction. No parenchymal hemorrhage. No mass lesion. No extra-axial collection. No mass effect or midline shift. No hydrocephalus. Basilar cisterns are patent. Vascular: No hyperdense vessel. Atherosclerotic calcifications are present within the cavernous internal carotid and vertebral arteries. Skull: No acute fracture or focal lesion. Sinuses/Orbits: Paranasal sinuses and mastoid air cells are clear. Bilateral lens replacement. Otherwise the orbits are unremarkable. Other: None. CT CERVICAL SPINE FINDINGS Alignment: Normal. Skull base and vertebrae: Multilevel degenerative changes of the spine most prominent at the C5-C6 level. No acute fracture. No aggressive appearing focal osseous lesion or focal pathologic process. Soft tissues and spinal canal: No prevertebral fluid or swelling. No visible canal hematoma. Upper chest: Unremarkable. Other: A 1.7 cm peripherally  calcified hypodense nodule within the left thyroid gland. Atherosclerotic plaque of the carotid arteries within the neck. Atherosclerotic plaque of the aortic arch. IMPRESSION: 1. No acute intracranial abnormality. 2. No acute displaced fracture or traumatic listhesis of the cervical spine. 3. A 1.7 cm peripherally calcified hypodense nodule within the left thyroid gland. Recommend thyroid US (ref: J Am Coll Radiol. 2015 Feb;12(2): 143-50). In the setting of significant comorbidities or limited life expectancy, no follow-up recommended (ref: J Am Coll Radiol. 2015 Feb;12(2): 143-50). 4.  Aortic Atherosclerosis (ICD10-I70.0). Electronically Signed   By: Tish Frederickson M.D.   On: 02/19/2020 05:11   CT Cervical Spine Wo Contrast  Result Date: 02/19/2020 CLINICAL DATA:  Unwitnessed fall. EXAM: CT HEAD WITHOUT CONTRAST CT CERVICAL SPINE WITHOUT CONTRAST TECHNIQUE: Multidetector CT imaging of the head and cervical spine was performed following the standard protocol without intravenous contrast. Multiplanar CT image reconstructions of the cervical spine were also generated. COMPARISON:  None. FINDINGS: CT HEAD FINDINGS Brain: Cerebral ventricle sizes are concordant with the degree of cerebral volume loss. Chronic right occipital lobe  infarction. Patchy and confluent areas of decreased attenuation are noted throughout the deep and periventricular white matter of the cerebral hemispheres bilaterally, compatible with chronic microvascular ischemic disease. No evidence of large-territorial acute infarction. No parenchymal hemorrhage. No mass lesion. No extra-axial collection. No mass effect or midline shift. No hydrocephalus. Basilar cisterns are patent. Vascular: No hyperdense vessel. Atherosclerotic calcifications are present within the cavernous internal carotid and vertebral arteries. Skull: No acute fracture or focal lesion. Sinuses/Orbits: Paranasal sinuses and mastoid air cells are clear. Bilateral lens  replacement. Otherwise the orbits are unremarkable. Other: None. CT CERVICAL SPINE FINDINGS Alignment: Normal. Skull base and vertebrae: Multilevel degenerative changes of the spine most prominent at the C5-C6 level. No acute fracture. No aggressive appearing focal osseous lesion or focal pathologic process. Soft tissues and spinal canal: No prevertebral fluid or swelling. No visible canal hematoma. Upper chest: Unremarkable. Other: A 1.7 cm peripherally calcified hypodense nodule within the left thyroid gland. Atherosclerotic plaque of the carotid arteries within the neck. Atherosclerotic plaque of the aortic arch. IMPRESSION: 1. No acute intracranial abnormality. 2. No acute displaced fracture or traumatic listhesis of the cervical spine. 3. A 1.7 cm peripherally calcified hypodense nodule within the left thyroid gland. Recommend thyroid US (ref: J Am Coll Radiol. 2015 Feb;12(2): 143-50). In the setting of significant comorbidities or limited life expectancy, no follow-up recommended (ref: J Am Coll Radiol. 2015 Feb;12(2): 143-50). 4.  Aortic Atherosclerosis (ICD10-I70.0). Electronically Signed   By: Tish Frederickson M.D.   On: 02/19/2020 05:11   US Venous Img Lower Bilateral (DVT)  Result Date: 02/22/2020 CLINICAL DATA:  80 year old female with lower extremity pain for 1 week. EXAM: BILATERAL LOWER EXTREMITY VENOUS DOPPLER ULTRASOUND TECHNIQUE: Gray-scale sonography with graded compression, as well as color Doppler and duplex ultrasound were performed to evaluate the lower extremity deep venous systems from the level of the common femoral vein and including the common femoral, femoral, profunda femoral, popliteal and calf veins including the posterior tibial, peroneal and gastrocnemius veins when visible. The superficial great saphenous vein was also interrogated. Spectral Doppler was utilized to evaluate flow at rest and with distal augmentation maneuvers in the common femoral, femoral and popliteal veins.  COMPARISON:  None. FINDINGS: RIGHT LOWER EXTREMITY Common Femoral Vein: No evidence of thrombus. Normal compressibility, respiratory phasicity and response to augmentation. Saphenofemoral Junction: No evidence of thrombus. Normal compressibility and flow on color Doppler imaging. Profunda Femoral Vein: No evidence of thrombus. Normal compressibility and flow on color Doppler imaging. Femoral Vein: No evidence of thrombus. Normal compressibility, respiratory phasicity and response to augmentation. Popliteal Vein: No evidence of thrombus. Normal compressibility, respiratory phasicity and response to augmentation. Calf Veins: No evidence of thrombus. Normal compressibility and flow on color Doppler imaging. Other Findings:  None. LEFT LOWER EXTREMITY Common Femoral Vein: No evidence of thrombus. Normal compressibility, respiratory phasicity and response to augmentation. Saphenofemoral Junction: No evidence of thrombus. Normal compressibility and flow on color Doppler imaging. Profunda Femoral Vein: No evidence of thrombus. Normal compressibility and flow on color Doppler imaging. Femoral Vein: No evidence of thrombus. Normal compressibility, respiratory phasicity and response to augmentation. Popliteal Vein: No evidence of thrombus. Normal compressibility, respiratory phasicity and response to augmentation. Calf Veins: No evidence of thrombus. Normal compressibility and flow on color Doppler imaging. Other Findings:  None. IMPRESSION: No evidence of bilateral lower extremity deep vein thrombosis. Marliss Coots, MD [facility] Electronically Signed   By: Marliss Coots MD   On: 02/22/2020 14:17  DG HIP OPERATIVE UNILAT W OR W/O PELVIS LEFT  Result Date: 02/20/2020 CLINICAL DATA:  Left hip ORIF. EXAM: OPERATIVE LEFT HIP (WITH PELVIS IF PERFORMED) TECHNIQUE: Fluoroscopic spot image(s) were submitted for interpretation post-operatively. FLUOROSCOPY TIME:  2  minutes, 57 seconds. C-arm fluoroscopic images were obtained intraoperatively and submitted for post operative interpretation. COMPARISON:  Left hip x-rays from yesterday. FINDINGS: Four intraoperative fluoroscopic images demonstrate interval cephalomedullary rod fixation of the left intertrochanteric femur fracture, now in anatomic alignment. IMPRESSION: Intraoperative fluoroscopic guidance for left intertrochanteric femur fracture ORIF. Electronically Signed   By: Obie DredgeWilliam T Derry M.D.   On: 02/20/2020 12:06   DG HIP UNILAT WITH PELVIS 2-3 VIEWS LEFT  Result Date: 02/19/2020 CLINICAL DATA:  80 year old female status post unwitnessed fall. Left hip pain. EXAM: DG HIP (WITH OR WITHOUT PELVIS) 2-3V LEFT COMPARISON:  None. FINDINGS: Extensive aortoiliac and proximal right femoral artery vascular calcifications. Femoral heads remain normally located. The pelvis appears intact. Negative visible bowel gas pattern. Grossly intact proximal right femur. Comminuted left intertrochanteric fracture with medial displacement 1/2 shaft width, over riding of 2-3 cm, anterior displacement 1/2 shaft width and mild posterior angulation. IMPRESSION: Comminuted left intertrochanteric fracture with displacement, overriding of fragments. Electronically Signed   By: Odessa FlemingH  Hall M.D.   On: 02/19/2020 04:32       Subjective: No sob, or cp this am. Mild pain this am  Discharge Exam: Vitals:   02/23/20 0816 02/23/20 1152  BP: (!) 143/64 (!) 161/51  Pulse: 67 71  Resp: 18 18  Temp: 97.7 F (36.5 C) 98 F (36.7 C)  SpO2: 98% 99%   Vitals:   02/22/20 2356 02/23/20 0456 02/23/20 0816 02/23/20 1152  BP: (!) 155/54 (!) 160/53 (!) 143/64 (!) 161/51  Pulse: 74 66 67 71  Resp: 16 17 18 18   Temp: 98.4 F (36.9 C) 97.8 F (36.6 C) 97.7 F (36.5 C) 98 F (36.7 C)  TempSrc: Oral Oral Oral   SpO2: 97% 98% 98% 99%  Weight:      Height:        General: Pt is alert, awake, not in acute distress Cardiovascular: RRR, S1/S2 +,  no rubs, no gallops Respiratory: CTA bilaterally, no wheezing, no rhonchi Abdominal: Soft, NT, ND, bowel sounds + Extremities: no edema    The results of significant diagnostics from this hospitalization (including imaging, microbiology, ancillary and laboratory) are listed below for reference.     Microbiology: Recent Results (from the past 240 hour(s))  Resp Panel by RT-PCR (Flu A&B, Covid) Nasopharyngeal Swab     Status: None   Collection Time: 02/19/20  4:03 AM   Specimen: Nasopharyngeal Swab; Nasopharyngeal(NP) swabs in vial transport medium  Result Value Ref Range Status   SARS Coronavirus 2 by RT PCR NEGATIVE NEGATIVE Final    Comment: (NOTE) SARS-CoV-2 target nucleic acids are NOT DETECTED.  The SARS-CoV-2 RNA is generally detectable in upper respiratory specimens during the acute phase of infection. The lowest concentration of SARS-CoV-2 viral copies this assay can detect is 138 copies/mL. A negative result does not preclude SARS-Cov-2 infection and should not be used as the sole basis for treatment or other patient management decisions. A negative result may occur with  improper specimen collection/handling, submission of specimen other than nasopharyngeal swab, presence of viral mutation(s) within the areas targeted by this assay, and inadequate number of viral copies(<138 copies/mL). A negative result must be combined with clinical observations, patient history, and epidemiological information. The expected result is Negative.  Fact Sheet for Patients:  BloggerCourse.com  Fact Sheet for Healthcare Providers:  SeriousBroker.it  This test is no t yet approved or cleared by the [place] FDA and  has been authorized for detection and/or diagnosis of SARS-CoV-2 by FDA under an Emergency Use Authorization (EUA). This EUA will remain  in effect (meaning this test can be used) for the duration of the COVID-19  declaration under Section 564(b)(1) of the Act, 21 U.S.C.section 360bbb-3(b)(1), unless the authorization is terminated  or revoked sooner.       Influenza A by PCR NEGATIVE NEGATIVE Final   Influenza B by PCR NEGATIVE NEGATIVE Final    Comment: (NOTE) The Xpert Xpress SARS-CoV-2/FLU/RSV plus assay is intended as an aid in the diagnosis of influenza from Nasopharyngeal swab specimens and should not be used as a sole basis for treatment. Nasal washings and aspirates are unacceptable for Xpert Xpress SARS-CoV-2/FLU/RSV testing.  Fact Sheet for Patients: BloggerCourse.com  Fact Sheet for Healthcare Providers: SeriousBroker.it  This test is not yet approved or cleared by the [place] FDA and has been authorized for detection and/or diagnosis of SARS-CoV-2 by FDA under an Emergency Use Authorization (EUA). This EUA will remain in effect (meaning this test can be used) for the duration of the COVID-19 declaration under Section 564(b)(1) of the Act, 21 U.S.C. section 360bbb-3(b)(1), unless the authorization is terminated or revoked.  Performed at [facility], [address] [place], [place] [address]   MRSA PCR Screening     Status: None   Collection Time: 02/19/20  6:26 PM   Specimen: Nasopharyngeal  Result Value Ref Range Status   MRSA by PCR NEGATIVE NEGATIVE Final    Comment:        The GeneXpert MRSA Assay (FDA approved for NASAL specimens only), is one component of a comprehensive MRSA colonization surveillance program. It is not intended to diagnose MRSA infection nor to guide or monitor treatment for MRSA infections. Performed at [facility], [address] [place], [place] [address]      Labs: BNP (last 3 results) Recent Labs    02/19/20 0403  BNP 1,080.0*   Basic Metabolic Panel: Recent Labs  Lab 02/19/20 0355 02/20/20 0539 02/21/20 0432 02/22/20 0359  NA 141 139 136 136   K 3.7 4.1 4.2 4.4  CL 106 104 102 103  CO2 23 23 24 25   GLUCOSE 160* 138* 152* 139*  BUN 16 14 27* 37*  CREATININE 0.96 0.82 0.98 0.94  CALCIUM 8.7* 8.6* 8.3* 8.2*   Liver Function Tests: Recent Labs  Lab 02/19/20 0355  AST 17  ALT 11  ALKPHOS 87  BILITOT 0.5  PROT 6.6  ALBUMIN 3.2*   No results for input(s): LIPASE, AMYLASE in the last 168 hours. No results for input(s): AMMONIA in the last 168 hours. CBC: Recent Labs  Lab 02/19/20 0355 02/20/20 0539 02/21/20 0432 02/21/20 1635 02/22/20 0359 02/23/20 0444  WBC 6.8 7.2 6.9  --  8.2  --   NEUTROABS 3.7  --   --   --   --   --   HGB 9.3* 9.0* 6.8* 8.3* 8.3* 8.8*  HCT 31.4* 29.1* 21.9* 26.7* 27.1*  --   MCV 88.2 86.6 86.6  --  86.3  --   PLT 290 275 221  --  235  --    Cardiac Enzymes: No results for input(s): CKTOTAL, CKMB, CKMBINDEX, TROPONINI in the last 168 hours. BNP: Invalid input(s): POCBNP CBG: Recent Labs  Lab  02/22/20 0814 02/22/20 1605 02/22/20 2100 02/22/20 2356 02/23/20 0453  GLUCAP 156* 160* 175* 176* 132*   D-Dimer No results for input(s): DDIMER in the last 72 hours. Hgb A1c No results for input(s): HGBA1C in the last 72 hours. Lipid Profile No results for input(s): CHOL, HDL, LDLCALC, TRIG, CHOLHDL, LDLDIRECT in the last 72 hours. Thyroid function studies No results for input(s): TSH, T4TOTAL, T3FREE, THYROIDAB in the last 72 hours.  Invalid input(s): FREET3 Anemia work up No results for input(s): VITAMINB12, FOLATE, FERRITIN, TIBC, IRON, RETICCTPCT in the last 72 hours. Urinalysis No results found for: COLORURINE, APPEARANCEUR, LABSPEC, PHURINE, GLUCOSEU, HGBUR, BILIRUBINUR, KETONESUR, PROTEINUR, UROBILINOGEN, NITRITE, LEUKOCYTESUR Sepsis Labs Invalid input(s): PROCALCITONIN,  WBC,  LACTICIDVEN Microbiology Recent Results (from the past 240 hour(s))  Resp Panel by RT-PCR (Flu A&B, Covid) Nasopharyngeal Swab     Status: None   Collection Time: 02/19/20  4:03 AM   Specimen:  Nasopharyngeal Swab; Nasopharyngeal(NP) swabs in vial transport medium  Result Value Ref Range Status   SARS Coronavirus 2 by RT PCR NEGATIVE NEGATIVE Final    Comment: (NOTE) SARS-CoV-2 target nucleic acids are NOT DETECTED.  The SARS-CoV-2 RNA is generally detectable in upper respiratory specimens during the acute phase of infection. The lowest concentration of SARS-CoV-2 viral copies this assay can detect is 138 copies/mL. A negative result does not preclude SARS-Cov-2 infection and should not be used as the sole basis for treatment or other patient management decisions. A negative result may occur with  improper specimen collection/handling, submission of specimen other than nasopharyngeal swab, presence of viral mutation(s) within the areas targeted by this assay, and inadequate number of viral copies(<138 copies/mL). A negative result must be combined with clinical observations, patient history, and epidemiological information. The expected result is Negative.  Fact Sheet for Patients:  BloggerCourse.com  Fact Sheet for Healthcare Providers:  SeriousBroker.it  This test is no t yet approved or cleared by the [place] FDA and  has been authorized for detection and/or diagnosis of SARS-CoV-2 by FDA under an Emergency Use Authorization (EUA). This EUA will remain  in effect (meaning this test can be used) for the duration of the COVID-19 declaration under Section 564(b)(1) of the Act, 21 U.S.C.section 360bbb-3(b)(1), unless the authorization is terminated  or revoked sooner.       Influenza A by PCR NEGATIVE NEGATIVE Final   Influenza B by PCR NEGATIVE NEGATIVE Final    Comment: (NOTE) The Xpert Xpress SARS-CoV-2/FLU/RSV plus assay is intended as an aid in the diagnosis of influenza from Nasopharyngeal swab specimens and should not be used as a sole basis for treatment. Nasal washings and aspirates are unacceptable for  Xpert Xpress SARS-CoV-2/FLU/RSV testing.  Fact Sheet for Patients: BloggerCourse.com  Fact Sheet for Healthcare Providers: SeriousBroker.it  This test is not yet approved or cleared by the [place] FDA and has been authorized for detection and/or diagnosis of SARS-CoV-2 by FDA under an Emergency Use Authorization (EUA). This EUA will remain in effect (meaning this test can be used) for the duration of the COVID-19 declaration under Section 564(b)(1) of the Act, 21 U.S.C. section 360bbb-3(b)(1), unless the authorization is terminated or revoked.  Performed at [facility], [address] [place], [place] [address]   MRSA PCR Screening     Status: None   Collection Time: 02/19/20  6:26 PM   Specimen: Nasopharyngeal  Result Value Ref Range Status   MRSA by PCR NEGATIVE NEGATIVE Final    Comment:  The GeneXpert MRSA Assay (FDA approved for NASAL specimens only), is one component of a comprehensive MRSA colonization surveillance program. It is not intended to diagnose MRSA infection nor to guide or monitor treatment for MRSA infections. Performed at [facility], [address], [place], [place] [address]      Time coordinating discharge: Over 30 minutes  SIGNED:   Lynn Ito, MD  [facility] 02/23/2020, 1:52 PM Pager   If 7PM-7AM, please contact night-coverage [email or web address] Password [email or web address]

## 2020-02-23 NOTE — Progress Notes (Signed)
  Subjective: 3 Days Post-Op Procedure(s) (LRB): INTRAMEDULLARY (IM) NAIL INTERTROCHANTRIC (Left) Patient reports pain as moderate.  Somewhat better this morning.  Still left calf pain.  Negative for DVT. Patient is well, and has had no acute complaints or problems Plan is to go Skilled nursing facility after hospital stay. Negative for chest pain and shortness of breath Fever: no Gastrointestinal: Negative for nausea and vomiting  Objective: Vital signs in last 24 hours: Temp:  [97.7 F (36.5 C)-98.4 F (36.9 C)] 97.8 F (36.6 C) (02/16 0456) Pulse Rate:  [60-74] 66 (02/16 0456) Resp:  [16-20] 17 (02/16 0456) BP: (154-172)/(53-96) 160/53 (02/16 0456) SpO2:  [97 %-100 %] 98 % (02/16 0456)  Intake/Output from previous day: No intake or output data in the 24 hours ending 02/23/20 0657  Intake/Output this shift: No intake/output data recorded.  Labs: Recent Labs    02/21/20 0432 02/21/20 1635 02/22/20 0359 02/23/20 0444  HGB 6.8* 8.3* 8.3* 8.8*   Recent Labs    02/21/20 0432 02/21/20 1635 02/22/20 0359  WBC 6.9  --  8.2  RBC 2.53*  --  3.14*  HCT 21.9* 26.7* 27.1*  PLT 221  --  235   Recent Labs    02/21/20 0432 02/22/20 0359  NA 136 136  K 4.2 4.4  CL 102 103  CO2 24 25  BUN 27* 37*  CREATININE 0.98 0.94  GLUCOSE 152* 139*  CALCIUM 8.3* 8.2*   No results for input(s): LABPT, INR in the last 72 hours.   EXAM General - Patient is Alert and Oriented Bilateral lower extremities- Neurovascular intact Sensation intact distally Dorsiflexion/Plantar flexion intact Compartment soft  Calf pain bilaterally. Dressing/Incision - clean, dry, scant drainage Motor Function - intact, moving foot and toes well on exam.   Past Medical History:  Diagnosis Date  . Chronic a-fib (HCC)   . Chronic diastolic (congestive) heart failure (HCC)   . Diabetes mellitus without complication (HCC)   . DVT (deep venous thrombosis) (HCC)   . HTN (hypertension)   . Renal  disorder   . Stroke [facility])     Assessment/Plan: 3 Days Post-Op Procedure(s) (LRB): INTRAMEDULLARY (IM) NAIL INTERTROCHANTRIC (Left) Principal Problem:   Closed left hip fracture (HCC) Active Problems:   Diabetes mellitus without complication (HCC)   Normocytic anemia   HTN (hypertension)   Stroke (HCC)   Chronic a-fib (HCC)   Chronic diastolic (congestive) heart failure (HCC)   Fall   Thyroid nodule   Dementia without behavioral disturbance (HCC)  Estimated body mass index is 26.83 kg/m as calculated from the following:   Height as of this encounter: 5\' 1"  (1.549 m).   Weight as of this encounter: 64.4 kg. Advance diet Up with therapy   Acute blood loss anemia hemoglobin 8.8, stable. Status post 1 unit of packed red blood cells. Vital signs are stable Pain well controlled Negative ultrasound for DVT bilaterally.  Follow-up at [facility] clinic orthopedics in 2 weeks for staple removal and left femur x-rays.  DVT Prophylaxis - TED hose and SCDs Weight-Bearing as tolerated to left leg  [facility], PA-C Orthopaedic Surgery 02/23/2020, 6:57 AM

## 2020-02-24 DIAGNOSIS — M8000XD Age-related osteoporosis with current pathological fracture, unspecified site, subsequent encounter for fracture with routine healing: Secondary | ICD-10-CM | POA: Insufficient documentation

## 2020-09-22 ENCOUNTER — Inpatient Hospital Stay: Payer: Medicare Other

## 2020-09-22 ENCOUNTER — Other Ambulatory Visit: Payer: Self-pay

## 2020-09-22 ENCOUNTER — Emergency Department: Payer: Medicare Other

## 2020-09-22 ENCOUNTER — Inpatient Hospital Stay
Admission: EM | Admit: 2020-09-22 | Discharge: 2020-10-07 | DRG: 871 | Disposition: E | Payer: Medicare Other | Attending: Student in an Organized Health Care Education/Training Program | Admitting: Student in an Organized Health Care Education/Training Program

## 2020-09-22 ENCOUNTER — Encounter: Payer: Self-pay | Admitting: Emergency Medicine

## 2020-09-22 DIAGNOSIS — E860 Dehydration: Secondary | ICD-10-CM | POA: Diagnosis present

## 2020-09-22 DIAGNOSIS — E1169 Type 2 diabetes mellitus with other specified complication: Secondary | ICD-10-CM | POA: Diagnosis present

## 2020-09-22 DIAGNOSIS — Z515 Encounter for palliative care: Secondary | ICD-10-CM | POA: Diagnosis not present

## 2020-09-22 DIAGNOSIS — I69354 Hemiplegia and hemiparesis following cerebral infarction affecting left non-dominant side: Secondary | ICD-10-CM

## 2020-09-22 DIAGNOSIS — L03115 Cellulitis of right lower limb: Secondary | ICD-10-CM | POA: Diagnosis present

## 2020-09-22 DIAGNOSIS — E870 Hyperosmolality and hypernatremia: Secondary | ICD-10-CM | POA: Diagnosis not present

## 2020-09-22 DIAGNOSIS — L89610 Pressure ulcer of right heel, unstageable: Secondary | ICD-10-CM | POA: Diagnosis present

## 2020-09-22 DIAGNOSIS — Z8249 Family history of ischemic heart disease and other diseases of the circulatory system: Secondary | ICD-10-CM

## 2020-09-22 DIAGNOSIS — M86671 Other chronic osteomyelitis, right ankle and foot: Secondary | ICD-10-CM | POA: Diagnosis present

## 2020-09-22 DIAGNOSIS — I5032 Chronic diastolic (congestive) heart failure: Secondary | ICD-10-CM | POA: Diagnosis present

## 2020-09-22 DIAGNOSIS — E11621 Type 2 diabetes mellitus with foot ulcer: Secondary | ICD-10-CM | POA: Diagnosis present

## 2020-09-22 DIAGNOSIS — I701 Atherosclerosis of renal artery: Secondary | ICD-10-CM | POA: Diagnosis present

## 2020-09-22 DIAGNOSIS — K559 Vascular disorder of intestine, unspecified: Secondary | ICD-10-CM | POA: Diagnosis present

## 2020-09-22 DIAGNOSIS — A411 Sepsis due to other specified staphylococcus: Secondary | ICD-10-CM | POA: Diagnosis present

## 2020-09-22 DIAGNOSIS — R000 Tachycardia, unspecified: Secondary | ICD-10-CM | POA: Diagnosis present

## 2020-09-22 DIAGNOSIS — Z20822 Contact with and (suspected) exposure to covid-19: Secondary | ICD-10-CM | POA: Diagnosis present

## 2020-09-22 DIAGNOSIS — R1310 Dysphagia, unspecified: Secondary | ICD-10-CM | POA: Diagnosis present

## 2020-09-22 DIAGNOSIS — E872 Acidosis, unspecified: Secondary | ICD-10-CM

## 2020-09-22 DIAGNOSIS — M86371 Chronic multifocal osteomyelitis, right ankle and foot: Secondary | ICD-10-CM | POA: Diagnosis not present

## 2020-09-22 DIAGNOSIS — L03116 Cellulitis of left lower limb: Secondary | ICD-10-CM | POA: Diagnosis present

## 2020-09-22 DIAGNOSIS — I70229 Atherosclerosis of native arteries of extremities with rest pain, unspecified extremity: Secondary | ICD-10-CM | POA: Diagnosis not present

## 2020-09-22 DIAGNOSIS — L89152 Pressure ulcer of sacral region, stage 2: Secondary | ICD-10-CM | POA: Diagnosis present

## 2020-09-22 DIAGNOSIS — R6521 Severe sepsis with septic shock: Secondary | ICD-10-CM | POA: Diagnosis present

## 2020-09-22 DIAGNOSIS — E1152 Type 2 diabetes mellitus with diabetic peripheral angiopathy with gangrene: Secondary | ICD-10-CM | POA: Diagnosis present

## 2020-09-22 DIAGNOSIS — M869 Osteomyelitis, unspecified: Secondary | ICD-10-CM

## 2020-09-22 DIAGNOSIS — L02419 Cutaneous abscess of limb, unspecified: Secondary | ICD-10-CM

## 2020-09-22 DIAGNOSIS — N179 Acute kidney failure, unspecified: Secondary | ICD-10-CM | POA: Diagnosis present

## 2020-09-22 DIAGNOSIS — L03119 Cellulitis of unspecified part of limb: Secondary | ICD-10-CM | POA: Diagnosis not present

## 2020-09-22 DIAGNOSIS — A419 Sepsis, unspecified organism: Secondary | ICD-10-CM | POA: Diagnosis not present

## 2020-09-22 DIAGNOSIS — I482 Chronic atrial fibrillation, unspecified: Secondary | ICD-10-CM | POA: Diagnosis present

## 2020-09-22 DIAGNOSIS — N1831 Chronic kidney disease, stage 3a: Secondary | ICD-10-CM | POA: Diagnosis present

## 2020-09-22 DIAGNOSIS — G9340 Encephalopathy, unspecified: Secondary | ICD-10-CM | POA: Diagnosis not present

## 2020-09-22 DIAGNOSIS — L8990 Pressure ulcer of unspecified site, unspecified stage: Secondary | ICD-10-CM | POA: Insufficient documentation

## 2020-09-22 DIAGNOSIS — I130 Hypertensive heart and chronic kidney disease with heart failure and stage 1 through stage 4 chronic kidney disease, or unspecified chronic kidney disease: Secondary | ICD-10-CM | POA: Diagnosis present

## 2020-09-22 DIAGNOSIS — G9341 Metabolic encephalopathy: Secondary | ICD-10-CM | POA: Diagnosis present

## 2020-09-22 DIAGNOSIS — Z66 Do not resuscitate: Secondary | ICD-10-CM | POA: Diagnosis present

## 2020-09-22 DIAGNOSIS — L97529 Non-pressure chronic ulcer of other part of left foot with unspecified severity: Secondary | ICD-10-CM | POA: Diagnosis present

## 2020-09-22 DIAGNOSIS — L89620 Pressure ulcer of left heel, unstageable: Secondary | ICD-10-CM | POA: Diagnosis present

## 2020-09-22 DIAGNOSIS — E1122 Type 2 diabetes mellitus with diabetic chronic kidney disease: Secondary | ICD-10-CM | POA: Diagnosis present

## 2020-09-22 DIAGNOSIS — R197 Diarrhea, unspecified: Secondary | ICD-10-CM | POA: Diagnosis present

## 2020-09-22 DIAGNOSIS — E875 Hyperkalemia: Secondary | ICD-10-CM | POA: Diagnosis present

## 2020-09-22 DIAGNOSIS — E785 Hyperlipidemia, unspecified: Secondary | ICD-10-CM | POA: Diagnosis present

## 2020-09-22 DIAGNOSIS — F0150 Vascular dementia without behavioral disturbance: Secondary | ICD-10-CM | POA: Diagnosis present

## 2020-09-22 DIAGNOSIS — Z87891 Personal history of nicotine dependence: Secondary | ICD-10-CM

## 2020-09-22 DIAGNOSIS — Z7189 Other specified counseling: Secondary | ICD-10-CM | POA: Diagnosis not present

## 2020-09-22 DIAGNOSIS — E119 Type 2 diabetes mellitus without complications: Secondary | ICD-10-CM

## 2020-09-22 LAB — CBC WITH DIFFERENTIAL/PLATELET
Abs Immature Granulocytes: 0.35 10*3/uL — ABNORMAL HIGH (ref 0.00–0.07)
Basophils Absolute: 0 10*3/uL (ref 0.0–0.1)
Basophils Relative: 0 %
Eosinophils Absolute: 0.1 10*3/uL (ref 0.0–0.5)
Eosinophils Relative: 0 %
HCT: 36.5 % (ref 36.0–46.0)
Hemoglobin: 11.4 g/dL — ABNORMAL LOW (ref 12.0–15.0)
Immature Granulocytes: 1 %
Lymphocytes Relative: 2 %
Lymphs Abs: 0.4 10*3/uL — ABNORMAL LOW (ref 0.7–4.0)
MCH: 27.2 pg (ref 26.0–34.0)
MCHC: 31.2 g/dL (ref 30.0–36.0)
MCV: 87.1 fL (ref 80.0–100.0)
Monocytes Absolute: 1.4 10*3/uL — ABNORMAL HIGH (ref 0.1–1.0)
Monocytes Relative: 6 %
Neutro Abs: 22.5 10*3/uL — ABNORMAL HIGH (ref 1.7–7.7)
Neutrophils Relative %: 91 %
Platelets: 425 10*3/uL — ABNORMAL HIGH (ref 150–400)
RBC: 4.19 MIL/uL (ref 3.87–5.11)
RDW: 20.1 % — ABNORMAL HIGH (ref 11.5–15.5)
WBC: 24.9 10*3/uL — ABNORMAL HIGH (ref 4.0–10.5)
nRBC: 0 % (ref 0.0–0.2)

## 2020-09-22 LAB — GLUCOSE, CAPILLARY: Glucose-Capillary: 142 mg/dL — ABNORMAL HIGH (ref 70–99)

## 2020-09-22 LAB — COMPREHENSIVE METABOLIC PANEL
ALT: 25 U/L (ref 0–44)
AST: 38 U/L (ref 15–41)
Albumin: 3 g/dL — ABNORMAL LOW (ref 3.5–5.0)
Alkaline Phosphatase: 107 U/L (ref 38–126)
Anion gap: 12 (ref 5–15)
BUN: 89 mg/dL — ABNORMAL HIGH (ref 8–23)
CO2: 13 mmol/L — ABNORMAL LOW (ref 22–32)
Calcium: 10.5 mg/dL — ABNORMAL HIGH (ref 8.9–10.3)
Chloride: 111 mmol/L (ref 98–111)
Creatinine, Ser: 1.9 mg/dL — ABNORMAL HIGH (ref 0.44–1.00)
GFR, Estimated: 27 mL/min — ABNORMAL LOW (ref >60)
Glucose, Bld: 313 mg/dL — ABNORMAL HIGH (ref 70–99)
Potassium: 6.9 mmol/L (ref 3.5–5.1)
Sodium: 136 mmol/L (ref 135–145)
Total Bilirubin: 0.6 mg/dL (ref 0.3–1.2)
Total Protein: 7 g/dL (ref 6.5–8.1)

## 2020-09-22 LAB — BLOOD GAS, ARTERIAL
Acid-base deficit: 12 mmol/L — ABNORMAL HIGH (ref 0.0–2.0)
Bicarbonate: 11.9 mmol/L — ABNORMAL LOW (ref 20.0–28.0)
FIO2: 21
O2 Saturation: 97.8 %
Patient temperature: 37
pCO2 arterial: 23 mmHg — ABNORMAL LOW (ref 32.0–48.0)
pH, Arterial: 7.32 — ABNORMAL LOW (ref 7.350–7.450)
pO2, Arterial: 109 mmHg — ABNORMAL HIGH (ref 83.0–108.0)

## 2020-09-22 LAB — APTT: aPTT: 27 seconds (ref 24–36)

## 2020-09-22 LAB — PROTIME-INR
INR: 1.2 (ref 0.8–1.2)
Prothrombin Time: 15.4 seconds — ABNORMAL HIGH (ref 11.4–15.2)

## 2020-09-22 LAB — URINALYSIS, COMPLETE (UACMP) WITH MICROSCOPIC
Bilirubin Urine: NEGATIVE
Glucose, UA: NEGATIVE mg/dL
Ketones, ur: NEGATIVE mg/dL
Nitrite: NEGATIVE
Protein, ur: 100 mg/dL — AB
Specific Gravity, Urine: 1.02 (ref 1.005–1.030)
WBC, UA: >50 WBC/hpf — ABNORMAL HIGH (ref 0–5)
pH: 5 (ref 5.0–8.0)

## 2020-09-22 LAB — BASIC METABOLIC PANEL
Anion gap: 13 (ref 5–15)
BUN: 90 mg/dL — ABNORMAL HIGH (ref 8–23)
CO2: 15 mmol/L — ABNORMAL LOW (ref 22–32)
Calcium: 10.2 mg/dL (ref 8.9–10.3)
Chloride: 113 mmol/L — ABNORMAL HIGH (ref 98–111)
Creatinine, Ser: 1.65 mg/dL — ABNORMAL HIGH (ref 0.44–1.00)
GFR, Estimated: 32 mL/min — ABNORMAL LOW (ref >60)
Glucose, Bld: 208 mg/dL — ABNORMAL HIGH (ref 70–99)
Potassium: 4.4 mmol/L (ref 3.5–5.1)
Sodium: 141 mmol/L (ref 135–145)

## 2020-09-22 LAB — PHOSPHORUS: Phosphorus: 4.9 mg/dL — ABNORMAL HIGH (ref 2.5–4.6)

## 2020-09-22 LAB — RESP PANEL BY RT-PCR (FLU A&B, COVID) ARPGX2
Influenza A by PCR: NEGATIVE
Influenza B by PCR: NEGATIVE
SARS Coronavirus 2 by RT PCR: NEGATIVE

## 2020-09-22 LAB — MAGNESIUM: Magnesium: 2.5 mg/dL — ABNORMAL HIGH (ref 1.7–2.4)

## 2020-09-22 LAB — LACTIC ACID, PLASMA
Lactic Acid, Venous: 2.8 mmol/L (ref 0.5–1.9)
Lactic Acid, Venous: 2.9 mmol/L (ref 0.5–1.9)

## 2020-09-22 LAB — CBG MONITORING, ED: Glucose-Capillary: 237 mg/dL — ABNORMAL HIGH (ref 70–99)

## 2020-09-22 MED ORDER — NOREPINEPHRINE 4 MG/250ML-% IV SOLN
0.0000 ug/min | INTRAVENOUS | Status: DC
Start: 2020-09-22 — End: 2020-09-23

## 2020-09-22 MED ORDER — INSULIN ASPART 100 UNIT/ML IJ SOLN
10.0000 [IU] | Freq: Once | INTRAMUSCULAR | Status: AC
  Administered 2020-09-22: 10 [IU] via INTRAVENOUS
  Filled 2020-09-22: qty 1

## 2020-09-22 MED ORDER — CALCIUM GLUCONATE-NACL 1-0.675 GM/50ML-% IV SOLN
1.0000 g | Freq: Once | INTRAVENOUS | Status: AC
  Administered 2020-09-22: 1000 mg via INTRAVENOUS
  Filled 2020-09-22: qty 50

## 2020-09-22 MED ORDER — SODIUM CHLORIDE 0.9 % IV BOLUS
500.0000 mL | Freq: Once | INTRAVENOUS | Status: AC
  Administered 2020-09-22: 500 mL via INTRAVENOUS

## 2020-09-22 MED ORDER — SODIUM CHLORIDE 0.9 % IV SOLN
2.0000 g | INTRAVENOUS | Status: DC
  Filled 2020-09-22: qty 2

## 2020-09-22 MED ORDER — HEPARIN SODIUM (PORCINE) 5000 UNIT/ML IJ SOLN
5000.0000 [IU] | Freq: Three times a day (TID) | INTRAMUSCULAR | Status: DC
  Administered 2020-09-22 – 2020-09-26 (×12): 5000 [IU] via SUBCUTANEOUS
  Filled 2020-09-22 (×12): qty 1

## 2020-09-22 MED ORDER — LACTATED RINGERS IV BOLUS (SEPSIS)
1000.0000 mL | Freq: Once | INTRAVENOUS | Status: AC
  Administered 2020-09-22: 1000 mL via INTRAVENOUS

## 2020-09-22 MED ORDER — FENTANYL CITRATE (PF) 100 MCG/2ML IJ SOLN
12.5000 ug | Freq: Once | INTRAMUSCULAR | Status: AC
  Administered 2020-09-23: 12.5 ug via INTRAVENOUS
  Filled 2020-09-22: qty 2

## 2020-09-22 MED ORDER — DEXTROSE 50 % IV SOLN
1.0000 | Freq: Once | INTRAVENOUS | Status: AC
  Administered 2020-09-22: 50 mL via INTRAVENOUS
  Filled 2020-09-22: qty 50

## 2020-09-22 MED ORDER — SODIUM CHLORIDE 0.9 % IV SOLN
2.0000 g | Freq: Once | INTRAVENOUS | Status: AC
  Administered 2020-09-22: 2 g via INTRAVENOUS
  Filled 2020-09-22: qty 2

## 2020-09-22 MED ORDER — NOREPINEPHRINE 4 MG/250ML-% IV SOLN
INTRAVENOUS | Status: AC
  Administered 2020-09-22: 10 ug/min via INTRAVENOUS
  Filled 2020-09-22: qty 250

## 2020-09-22 MED ORDER — DOCUSATE SODIUM 100 MG PO CAPS: 100.0000 mg | ORAL_CAPSULE | Freq: Two times a day (BID) | ORAL | Status: DC | PRN

## 2020-09-22 MED ORDER — INSULIN ASPART 100 UNIT/ML IJ SOLN
0.0000 [IU] | INTRAMUSCULAR | Status: DC
Start: 2020-09-22 — End: 2020-09-26
  Administered 2020-09-22: 5 [IU] via SUBCUTANEOUS
  Administered 2020-09-22: 2 [IU] via SUBCUTANEOUS
  Administered 2020-09-23 – 2020-09-24 (×4): 3 [IU] via SUBCUTANEOUS
  Administered 2020-09-24 – 2020-09-25 (×2): 2 [IU] via SUBCUTANEOUS
  Administered 2020-09-25: 3 [IU] via SUBCUTANEOUS
  Administered 2020-09-25 (×2): 2 [IU] via SUBCUTANEOUS
  Filled 2020-09-22 (×11): qty 1

## 2020-09-22 MED ORDER — METRONIDAZOLE 500 MG/100ML IV SOLN
500.0000 mg | Freq: Once | INTRAVENOUS | Status: AC
  Administered 2020-09-22: 500 mg via INTRAVENOUS
  Filled 2020-09-22: qty 100

## 2020-09-22 MED ORDER — SODIUM BICARBONATE 8.4 % IV SOLN
50.0000 meq | Freq: Once | INTRAVENOUS | Status: AC
  Administered 2020-09-22: 50 meq via INTRAVENOUS
  Filled 2020-09-22: qty 50

## 2020-09-22 MED ORDER — VANCOMYCIN HCL IN DEXTROSE 1-5 GM/200ML-% IV SOLN
1000.0000 mg | Freq: Once | INTRAVENOUS | Status: AC
  Administered 2020-09-22: 1000 mg via INTRAVENOUS
  Filled 2020-09-22: qty 200

## 2020-09-22 MED ORDER — POLYETHYLENE GLYCOL 3350 17 G PO PACK: 17.0000 g | PACK | Freq: Every day | ORAL | Status: DC | PRN

## 2020-09-22 MED ORDER — VANCOMYCIN VARIABLE DOSE PER UNSTABLE RENAL FUNCTION (PHARMACIST DOSING): Status: DC

## 2020-09-22 MED ORDER — LACTATED RINGERS IV SOLN: INTRAVENOUS | Status: DC

## 2020-09-22 MED ORDER — SODIUM BICARBONATE 8.4 % IV SOLN
INTRAVENOUS | Status: AC
  Filled 2020-09-22: qty 1000
  Filled 2020-09-22: qty 150

## 2020-09-22 NOTE — ED Provider Notes (Signed)
[facility] Emergency Department Provider Note   ____________________________________________   Event Date/Time   First MD Initiated Contact with Patient Oct 06, 2020 1458     (approximate)  I have reviewed the triage vital signs and the nursing notes.  HISTORY  Chief Complaint Code Sepsis  HPI Virginia Mann is a 80 y.o. female the presents via EMS from long-term care facility for symptoms concerning for sepsis including tachycardia, tachypnea, and fever.  Per report, patient was recently discharged from [facility] to this long-term care facility approximately 3 days prior to arrival and has had altered mental status since arrival.  Per staff she has had GCS of 10-8 since her arrival.  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  Patient has DNR/DNI form       Past Medical History:  Diagnosis Date   Chronic a-fib (HCC)    Chronic diastolic (congestive) heart failure (HCC)    Diabetes mellitus without complication (HCC)    DVT (deep venous thrombosis) (HCC)    HTN (hypertension)    Renal disorder    Stroke [facility])     Patient Active Problem List   Diagnosis Date Noted   Sepsis (HCC) 2020-10-06   Dementia without behavioral disturbance (HCC)    Closed left hip fracture (HCC) 02/19/2020   Diabetes mellitus without complication (HCC)    Normocytic anemia    HTN (hypertension)    Stroke (HCC)    Chronic a-fib (HCC)    Chronic diastolic (congestive) heart failure (HCC)    Fall    Thyroid nodule     Past Surgical History:  Procedure Laterality Date   INTRAMEDULLARY (IM) NAIL INTERTROCHANTERIC Left 02/20/2020   Procedure: INTRAMEDULLARY (IM) NAIL INTERTROCHANTRIC;  Surgeon: Signa Kell, MD;  Location: [facility] ORS;  Service: Orthopedics;  Laterality: Left;    Prior to Admission medications   Medication Sig Start Date End Date Taking?  Authorizing Provider  acetaminophen (TYLENOL) 160 MG/5ML elixir Take 15 mg/kg by mouth 3 (three) times daily.   Yes [provider]  ascorbic acid (VITAMIN C) 500 MG tablet Take 1 tablet by mouth daily.   Yes [provider]  atorvastatin (LIPITOR) 40 MG tablet Take 40 mg by mouth at bedtime. 12/15/19  Yes [provider]  docusate sodium (COLACE) 100 MG capsule Take 1 capsule (100 mg total) by mouth 2 (two) times daily. Patient taking differently: Take 200 mg by mouth 2 (two) times daily. 02/23/20  Yes Lynn Ito, MD  gabapentin (NEURONTIN) 300 MG capsule Take 300 mg by mouth at bedtime. 09/19/20  Yes [provider]  ondansetron (ZOFRAN) 4 MG tablet Take 4 mg by mouth every 8 (eight) hours as needed. 09/19/20  Yes [provider]  oxyCODONE (OXY IR/ROXICODONE) 5 MG immediate release tablet Take 0.5-1 tablets (2.5-5 mg total) by mouth every 3 (three) hours as needed for moderate pain or severe pain (pain score 4-6). Patient taking differently: Take 5 mg by mouth every 4 (four) hours as needed for moderate pain or severe pain (pain score 4-6). 02/21/20  Yes Dedra Skeens, PA-C  sertraline (ZOLOFT) 100 MG tablet Take 100 mg by mouth daily. 09/19/20  Yes [provider]  sertraline (ZOLOFT) 50 MG tablet Take 50 mg by mouth daily.   Yes [provider]  acetaminophen (TYLENOL) 500 MG tablet Take 2 tablets (1,000 mg total) by mouth every 8 (eight) hours.  Patient not taking: No sig reported 02/23/20   Lynn Ito, MD  benazepril (LOTENSIN) 40 MG tablet Take 40 mg by mouth daily. Patient not taking: Reported on 08-Oct-2020 08/27/19   [provider]  bisacodyl (DULCOLAX) 10 MG suppository Place 1 suppository (10 mg total) rectally daily as needed for moderate constipation. Patient not taking: No sig reported 02/23/20   Lynn Ito, MD  Cholecalciferol 25 MCG (1000 UT) capsule Take 2,000 Units by mouth daily. Patient not taking: Reported on  10-08-20 08/24/19   [provider]  donepezil (ARICEPT) 10 MG tablet Take 10 mg by mouth at bedtime. Patient not taking: Reported on 10-08-20 01/14/20   [provider]  enoxaparin (LOVENOX) 40 MG/0.4ML injection Inject 0.4 mLs (40 mg total) into the skin daily for 14 doses. 02/21/20 03/06/20  Dedra Skeens, PA-C  Ensure Max Protein (ENSURE MAX PROTEIN) LIQD Take 330 mLs (11 oz total) by mouth daily. 02/24/20   Lynn Ito, MD  metFORMIN (GLUCOPHAGE-XR) 500 MG 24 hr tablet Take 500 mg by mouth daily with breakfast. Patient not taking: Reported on 2020/10/08 12/15/19   [provider]  methocarbamol (ROBAXIN) 500 MG tablet Take 1 tablet (500 mg total) by mouth every 8 (eight) hours as needed for muscle spasms. Patient not taking: No sig reported 02/23/20   Lynn Ito, MD  Multiple Vitamin (MULTIVITAMIN WITH MINERALS) TABS tablet Take 1 tablet by mouth daily. Patient not taking: No sig reported 02/24/20   Lynn Ito, MD  nutrition supplement, JUVEN, (JUVEN) PACK Take 1 packet by mouth 2 (two) times daily between meals. 02/24/20   Lynn Ito, MD  omeprazole (PRILOSEC) 40 MG capsule Take 40 mg by mouth daily before breakfast. Patient not taking: Reported on 10/08/2020 12/15/19   [provider]  polyethylene glycol (MIRALAX / GLYCOLAX) 17 g packet Take 17 g by mouth daily. Patient not taking: No sig reported 02/24/20   Lynn Ito, MD  senna-docusate (SENOKOT-S) 8.6-50 MG tablet Take 1 tablet by mouth 2 (two) times daily. Patient not taking: Reported on October 08, 2020    [provider]  traMADol (ULTRAM) 50 MG tablet Take 1 tablet (50 mg total) by mouth every 6 (six) hours as needed for moderate pain. Patient not taking: No sig reported 02/21/20   Dedra Skeens, PA-C    Allergies Apixaban  Family History  Problem Relation Age of Onset   CAD Father    Hypertension Father     Social History Social History   Tobacco Use   Smoking status: Former    Types:  Cigarettes   Smokeless tobacco: Never  Vaping Use   Vaping Use: Never used  Substance Use Topics   Alcohol use: Not Currently   Drug use: Not Currently    Review of Systems Unable to assess ____________________________________________   PHYSICAL EXAM:  VITAL SIGNS: ED Triage Vitals  Enc Vitals Group     BP Oct 08, 2020 1444 102/67     Pulse Rate October 08, 2020 1444 (!) 120     Resp 2020/10/08 1444 (!) 36     Temp --      Temp src --      SpO2 10-08-20 1444 99 %     Weight 10-08-2020 1436 115 lb 9.6 oz (52.4 kg)     Height 2020-10-08 1436 5\' 1"  (1.549 m)     Head Circumference --      Peak Flow --      Pain Score --      Pain Loc --  Pain Edu? --      Excl. in GC? --    Constitutional: Laying in stretcher in the fetal position with eyes closed but in no acute distress Eyes: Conjunctivae are injected. PERRL. Head: Atraumatic. Nose: No congestion/rhinnorhea. Mouth/Throat: Mucous membranes are dry. Neck: No stridor Cardiovascular: Grossly normal heart sounds.  Good peripheral circulation. Respiratory: Increased respiratory effort.  Tachypnea.  No retractions. Gastrointestinal: Soft and nontender. No distention. Musculoskeletal: No obvious deformities Neurologic: MA ES.  GCS 10 Skin:  Skin is warm and dry. No rash noted. Psychiatric: Cooperative  ____________________________________________   LABS (all labs ordered are listed, but only abnormal results are displayed)  Labs Reviewed  COMPREHENSIVE METABOLIC PANEL - Abnormal; Notable for the following components:      Result Value   Potassium 6.9 (*)    CO2 13 (*)    Glucose, Bld 313 (*)    BUN 89 (*)    Creatinine, Ser 1.90 (*)    Calcium 10.5 (*)    Albumin 3.0 (*)    GFR, Estimated 27 (*)    All other components within normal limits  LACTIC ACID, PLASMA - Abnormal; Notable for the following components:   Lactic Acid, Venous 2.8 (*)    All other components within normal limits  CBC WITH DIFFERENTIAL/PLATELET -  Abnormal; Notable for the following components:   WBC 24.9 (*)    Hemoglobin 11.4 (*)    RDW 20.1 (*)    Platelets 425 (*)    Neutro Abs 22.5 (*)    Lymphs Abs 0.4 (*)    Monocytes Absolute 1.4 (*)    Abs Immature Granulocytes 0.35 (*)    All other components within normal limits  PROTIME-INR - Abnormal; Notable for the following components:   Prothrombin Time 15.4 (*)    All other components within normal limits  BLOOD GAS, ARTERIAL - Abnormal; Notable for the following components:   pH, Arterial 7.32 (*)    pCO2 arterial 23 (*)    pO2, Arterial 109 (*)    Bicarbonate 11.9 (*)    Acid-base deficit 12.0 (*)    All other components within normal limits  BASIC METABOLIC PANEL - Abnormal; Notable for the following components:   Chloride 113 (*)    CO2 15 (*)    Glucose, Bld 208 (*)    BUN 90 (*)    Creatinine, Ser 1.65 (*)    GFR, Estimated 32 (*)    All other components within normal limits  LACTIC ACID, PLASMA - Abnormal; Notable for the following components:   Lactic Acid, Venous 2.9 (*)    All other components within normal limits  MAGNESIUM - Abnormal; Notable for the following components:   Magnesium 2.5 (*)    All other components within normal limits  PHOSPHORUS - Abnormal; Notable for the following components:   Phosphorus 4.9 (*)    All other components within normal limits  GLUCOSE, CAPILLARY - Abnormal; Notable for the following components:   Glucose-Capillary 142 (*)    All other components within normal limits  CBG MONITORING, ED - Abnormal; Notable for the following components:   Glucose-Capillary 237 (*)    All other components within normal limits  RESP PANEL BY RT-PCR (FLU A&B, COVID) ARPGX2  CULTURE, BLOOD (ROUTINE X 2)  CULTURE, BLOOD (ROUTINE X 2)  URINE CULTURE  MRSA NEXT GEN BY PCR, NASAL  URINALYSIS, COMPLETE (UACMP) WITH MICROSCOPIC  APTT  CBC  BASIC METABOLIC PANEL  MAGNESIUM  PHOSPHORUS  HEMOGLOBIN A1C  ____________________________________________  EKG  ED ECG REPORT I, Merwyn Katos, the attending physician, personally viewed and interpreted this ECG.  Date: 09/25/2020 EKG Time: 1438 Rate: 120 Rhythm: Atrial fibrillation with rapid ventricular response QRS Axis: normal Intervals: normal ST/T Wave abnormalities: normal Narrative Interpretation: Atrial fibrillation with rapid ventricular response.  No evidence of acute ischemia  ____________________________________________  RADIOLOGY  ED MD interpretation: One-view portable chest x-ray shows no evidence of acute abnormalities including no pneumonia, pneumothorax, or widened mediastinum  CT of the head shows age indeterminant suspected small infarct within the white matter adjacent of the right frontal horn that is new from previous CT as well as chronic right occipital infarct  Official radiology report(s): DG Chest 1 View  Result Date: 09/28/2020 CLINICAL DATA:  Sepsis. EXAM: CHEST  1 VIEW COMPARISON:  Chest x-ray 02/19/2020 FINDINGS: The heart and mediastinal contours are unchanged. Aortic calcification. No focal consolidation. No pulmonary edema. No pleural effusion. No pneumothorax. No acute osseous abnormality. IMPRESSION: No active disease. Electronically Signed   By: Tish Frederickson M.D.   On: 09/10/2020 15:54   CT Head Wo Contrast  Result Date: 09/28/2020 CLINICAL DATA:  Mental status change EXAM: CT HEAD WITHOUT CONTRAST TECHNIQUE: Contiguous axial images were obtained from the base of the skull through the vertex without intravenous contrast. COMPARISON:  CT 02/19/2020 FINDINGS: Brain: Limited by extensive artifact with obscured posterior fossa, posterior temporal lobes and occipital lobes. No gross hemorrhage or midline shift. 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 chronic right cerebellar infarcts. Interval encephalomalacia at the right posterior frontal vertex, series 3, image 63 consistent with chronic  infarct, but new as compared with February CT. Age indeterminate hypodensity within the white matter adjacent to the frontal horn, series 3, image 41. Moderate severe atrophy with chronic small vessel ischemic changes of the white matter. Nonenlarged ventricles. Vascular: Slightly limited assessment due to artifact. Vertebral and carotid vascular calcification. Skull: No fracture Sinuses/Orbits: Mucosal thickening in the sinuses Other: None IMPRESSION: 1. The study is limited by extensive artifact. No gross intracranial hemorrhage is seen 2. Age indeterminate hypodensity/suspected small infarct within the white matter adjacent to the right frontal horn, new since February CT. 3. Chronic right occipital infarct. Chronic appearing infarct at the right frontal vertex but new since February 2022. Probable multiple chronic infarcts in the right cerebellum 4. Atrophy with chronic small-vessel ischemic changes of the white matter Electronically Signed   By: Jasmine Pang M.D.   On: 09/23/2020 17:17   DG Foot Complete Left  Result Date: 09/07/2020 CLINICAL DATA:  Chronic wounds, fever and elevated white cell count. EXAM: LEFT FOOT - COMPLETE 3+ VIEW COMPARISON:  None. FINDINGS: Diffuse bone demineralization. Degenerative changes in the interphalangeal joints. Old healed fracture deformity of the fifth metatarsal bone. No acute fractures identified. No focal bone erosion or sclerosis to suggest osteomyelitis. Soft tissue lucencies over the calcaneus likely representing ulcerations. No soft tissue gas or radiopaque foreign body. Prominent vascular calcifications. IMPRESSION: Diffuse bone demineralization and degenerative changes. Old healed fracture of the fifth metatarsal. No acute bony abnormalities identified. Electronically Signed   By: Burman Nieves M.D.   On: 09/09/2020 21:38   DG Foot Complete Right  Result Date: 09/26/2020 CLINICAL DATA:  Chronic wounds. Fevers and elevated white cell count. EXAM: RIGHT  FOOT COMPLETE - 3+ VIEW COMPARISON:  None. FINDINGS: Diffuse bone demineralization. Degenerative changes in the interphalangeal joints. Toes are fixed in flexion, limiting evaluation. Possible hammertoe deformities. There is evidence of acral osteolysis  at the distal phalanx of the first toe. Bone lucency and cortical erosion along the second metatarsal head. Changes may indicate areas of osteomyelitis. No soft tissue gas or radiopaque soft tissue foreign bodies. Vascular calcifications. IMPRESSION: 1. Diffuse bone demineralization and degenerative changes. 2. 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 Lucency with cortical erosion at the second metatarsal head. These changes may indicate osteomyelitis in the appropriate clinical setting. Electronically Signed   By: Burman Nieves M.D.   On: 09/07/2020 21:40    ____________________________________________   PROCEDURES  Procedure(s) performed (including Critical Care):  .1-3 Lead EKG Interpretation Performed by: Merwyn Katos, MD Authorized by: Merwyn Katos, MD     Interpretation: normal     ECG rate:  88   ECG rate assessment: normal     Rhythm: sinus rhythm     Ectopy: none     Conduction: normal    CRITICAL CARE Performed by: Merwyn Katos   Total critical care time: 47 minutes  Critical care time was exclusive of separately billable procedures and treating other patients.  Critical care was necessary to treat or prevent imminent or life-threatening deterioration.  Critical care was time spent personally by me on the following activities: development of treatment plan with patient and/or surrogate as well as nursing, discussions with consultants, evaluation of patient's response to treatment, examination of patient, obtaining history from patient or surrogate, ordering and performing treatments and interventions, ordering and review of laboratory studies, ordering and review of radiographic studies, pulse oximetry and  re-evaluation of patient's condition.  ____________________________________________   INITIAL IMPRESSION / ASSESSMENT AND PLAN / ED COURSE  As part of my medical decision making, I reviewed the following data within the electronic medical record, if available:  Nursing notes reviewed and incorporated, Labs reviewed, EKG interpreted, Old chart reviewed, Radiograph reviewed and Notes from prior ED visits reviewed and incorporated     The Pt presents with bilateral lower extremity cellulitis, hypotension, tachycardia highly concerning for sepsis (suspected SSTI source). At this time, the Pt is satting well on room air, normotensive, and appears HDS.  Will start empiric antibiotics and fluids.  Due to hypotension, will administer fluids gradually with frequent reassessment. Have low suspicion for a GI, skin/soft tissue, or CNS source at this time, but will reconsider if initial workup is unremarkable. Differential diagnosis includes but is not limited to, cellulitis, osteomyelitis, urinary tract infection, CVA, TIA - CBC, BMP, LFTs - VBG - UA - BCx x2, Lactate - EKG - CXR - CT head - Empiric Abx: Flagyl, cefepime, vancomycin - Fluids: 30cc/kg LR  Patient appears to be in acute renal failure with elevated creatinine as well as potassium of 6.6.  Will give calcium gluconate, insulin, glucose empirically.  I to to patient's son at bedside at length regarding patient's prognosis and possible need for source control of these gangrenous feet as well as possible need for dialysis given her acute renal failure.  Son expressed willingness to have patient receive antibiotics and pressors but does not foresee surgery as an option at this time for source control.  It was explained to patient's son that without source control she will likely not get much better and he expressed understanding.  Consults: I spoke to Dr. Margo Aye in internal medicine on the hospitalist service who initially agreed to accept this  patient onto her service however during evaluation, patient's blood pressures reduced significantly despite IV fluid resuscitation and patient was started on a norepinephrine drip and therefore  was not appropriate for the floor or stepdown unit. I then spoke to on-call ICU provider who agrees to accept this patient onto their service for further evaluation and management  Dispo: Admit to the ICU     ____________________________________________   FINAL CLINICAL IMPRESSION(S) / ED DIAGNOSES  Final diagnoses:  Sepsis with acute renal failure and septic shock, due to unspecified organism, unspecified acute renal failure type (HCC)  Cellulitis and abscess of leg, except foot     ED Discharge Orders     None        Note:  This document was prepared using Dragon voice recognition software and may include unintentional dictation errors.    Merwyn Katos, MD October 21, 2020 [phone]

## 2020-09-22 NOTE — Progress Notes (Signed)
Received a call regarding admission of Ms. Virginia Mann who presented to [facility] ED via EMS from long-term care facility (recently discharged from [facility]) with tachycardia, tachypnea, fever with T-max of 101.5, WBC 24,000, with concern for sepsis.  Code sepsis was called in the ED.  Patient has multiple chronic wounds including a sacral ulcer and bilateral foot/heel ulcers in varying degrees of severity with surrounding erythema at the feet.  Patient has DNR/DNI form in the room.  In the room, patient is obtunded.  Hypotensive with MAP in the 50s.  Potassium 6.9, AG 12, and serum bicarb of 13.  Creatinine 1.90 from 0.9 baseline.  Lungs are clear on auscultation.  Hypovolemic on exam.  She has received 2 L IV fluid LR per code sepsis protocol.  Vasopressors, Levophed started in the room to maintain MAP greater than 65.  Recommend PCCM evaluation for possible ICU admission while on vasopressors.

## 2020-09-22 NOTE — ED Notes (Signed)
Pt transported to CT ?

## 2020-09-22 NOTE — Sepsis Progress Note (Signed)
Epic shows second lactic drawn at 1700, however, not resulted within code sepsis window.  Unable to clarify with bedside RN the delay.

## 2020-09-22 NOTE — Progress Notes (Signed)
eLink Physician-Brief Progress Note Patient Name: Virginia Mann DOB: [birth year] MRN: [id number]   Date of Service  09/24/2020  HPI/Events of Note  Pt admitted with septic shock and AKI  eICU Interventions  - on levophed - I changed her fluids from LR to d5w with 3 amps bicarb because of a NAGMA     Intervention Category Evaluation Type: New Patient Evaluation  [name] 10/05/2020, 11:10 PM

## 2020-09-22 NOTE — Consult Note (Signed)
PHARMACY -  BRIEF ANTIBIOTIC NOTE   Pharmacy has received consult(s) for sepsis from an ED provider.  The patient's profile has been reviewed for ht/wt/allergies/indication/available labs.    One time order(s) placed for cefepime 2 grams x 1, vancomycin 1,000 mg x 1, metronidazole 500 mg x 1  Further antibiotics/pharmacy consults should be ordered by admitting physician if indicated.                       Thank you, Jaynie Bream, PharmD Pharmacy Resident  09/25/20 3:36 PM

## 2020-09-22 NOTE — H&P (Addendum)
NAME:  Semone Orlov, MRN:  [id number], DOB:  23-Mar-1940, LOS: 0 ADMISSION DATE:  2020/10/11, CONSULTATION DATE:  2020-10-11 REFERRING MD:  Dr. Vicente Males, CHIEF COMPLAINT:  AMS & fever   History of Present Illness:  80 year old female presenting to [facility] ED on 10/11/20 from [facility] due to concerns for sepsis.  Per the patient's son and daughter who are at bedside she was recently hospitalized for 22 days at [facility] due to a stroke.  She was discharged to [facility] approximately 3 days ago and has had worsening altered mental status at the SNF.  Today the son became very concerned of developing sepsis after her lab work showed worsening leukocytosis in addition to the altered mental status and fever.  Of note the patient has been declining with 2 recent strokes and multiple chronic wounds including a sacral pressure injury as well as bilateral foot/heel wounds with surrounding erythema.  ED course: Initial vitals: Indicative of sepsis~febrile at 101.1, tachypneic 36, tachycardic 120 with marginal blood pressure 102/67 & SPO2 99% on room air.  Labs/ Imaging personally reviewed Significant labs: Na+ 136, K+6.9, AKI on CKD with BUN/Cr.:  89/1.9, NAGMA with serum CO2 13/AG 12, leukocytosis at 24.9 with left shift, lactic acidosis at 2.8, COVID-19/influenza A/B- negative, Hgb: 11.4, ABG: 7.32/ 23/ 109/ 11.9 CXR 10/11/20: No active disease CT head 10-11-2020: The study is limited by extensive artifact. No gross intracranial hemorrhage is seen. Age indeterminate hypodensity/suspected small infarct within the white matter adjacent to the right frontal horn. Chronic right occipital infarct. Chronic appearing infarct at the right frontal vertex . Probable multiple chronic infarcts in the right cerebellum. Atrophy with chronic small-vessel ischemic changes of the white matter.  Patient worked up per septic protocol receiving 2.5 L of LR and empiric antibiotic coverage: Cefepime/Flagyl and vancomycin  for suspected source dry gangrene on right foot.  Post IV fluid resuscitation patient hypotensive requiring initiation of Levophed drip.  PCCM consulted for admission.  All history gathered from son and daughter who are bedside as the patient is minimally responsive.  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  The patient and family have declined antibiotics or surgical intervention during hospitalization in August 2022.  Per the family the patient's vascular doctor at [facility] offered amputation but was concerned about the degree of vascular disease, thinking the patient would ultimately end up with an AKA.  Per family the patient responded to this decision stating, " I would rather die than have my leg cut off."  Her children confirmed the patient changed her own CODE STATUS prior to this admission to DNR/DNI.  We had a long discussion regarding goals of care and while they would like to treat the treatable with antibiotics and peripheral vasopressors, they are not interested in surgical intervention for source control.  Further discussion revealed that if the patient's vasopressor requirements were too high for peripheral IV administration, family would like to rediscuss goals of care at that time and consider comfort care at that time.  Family is also in agreement not to place p.o. access at this time.  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 Per family recent baseline since most recent stroke in August is eye-opening with focusing, alert enough to participate in being fed, will respond with the number 4 if asked a pain scale and can move right extremities as well as left lower extremity minimally.  Per family she is unable to use left upper extremity since most recent stroke.  Objective   Blood pressure (!) 123/59, pulse 94, temperature 99.5 F (37.5 C), resp. rate (!) 25, height 5\' 1"  (1.549 m), weight 52.4 kg, SpO2 100 %.        Intake/Output Summary (Last 24 hours) at 09/18/2020 2003 Last data filed at 09/08/2020 1916 Gross per 24 hour  Intake 3099 ml  Output --  Net 3099 ml   Filed Weights   09/30/2020 1436  Weight: 52.4 kg    Examination: General: Adult female, critically ill, frail and cachectic appearing, lying in bed, NAD HEENT: MM pink/moist, anicteric, atraumatic, neck supple Neuro: somnolent-unable to follow commands, grimacing to painful stimuli, nonverbal PERRL +3, BLE: 2/5, RUE: 2/5, LUE: 0/5 CV: s1s2 irregular, atrial fibrillation rate controlled on monitor, no r/m/g Pulm: Regular, non labored on room air, breath sounds diminished throughout GI: soft, rounded, non tender, bs x 4 GU: foley in place with cloudy amber urine Skin: Scattered ecchymosis, scabbed abrasion LUE & left knee. Known dry gangrene and chronic osteomyelitis in right second toe > per family patient has been receiving daily wound care Betadine soaked gauze on second and fifth toe on right foot.  Patient also has bilateral pressure injuries on the heels as well as a pressure injury to the fifth toe of the left foot.  Right foot pictured above  Left foot pictured above  Pressure Injury 09/20/2020 Coccyx Medial Stage 2 -  Partial thickness loss of dermis presenting as a shallow open injury with a red, pink wound bed  without slough. (Active)  09/07/2020 2344  Location: Coccyx  Location Orientation: Medial  Staging: Stage 2 -  Partial thickness loss of dermis presenting as a shallow open injury with a red, pink wound bed without slough.  Wound Description (Comments):   Present on Admission: Yes    Sacrum pictured above Extremities: Cool/dry, pulses + 2 R/ +1 P, no edema noted  Resolved Hospital Problem list     Assessment & Plan:  Sepsis with septic shock due to suspected acute on chronic osteomyelitis and dry gangrene of right foot Possible UTI? Severe PAD with chronic osteomyelitis and dry gangrene Lactic: 2.8, UA: Pending, CXR: No active disease  Initial interventions/workup included: Total 2.5 L of LR & Cefepime/ Vancomycin/ Metronidazole - Supplemental oxygen as needed, to maintain SpO2 > 90% - f/u cultures, trend lactic-stat lactic ordered - Daily CBC, monitor WBC/ fever curve - IV antibiotics: cefepime & vancomycin  -Continue IVF hydration - Continue peripheral vasopressors to maintain MAP< 65, norepinephrine > add peripheral phenylephrine if needed.  Per plan of care with family if patient requiring increased vasopressor support that warrants central line placement we will rediscuss GOC -Will obtain x-rays of bilateral feet to assess for worsening osteomyelitis and gangrene -Palliative care consult placed to assist in goals of care discussions > most recent hospitalization there was discussion about hospice placement.  This might  need to be part of discharge planning.  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  Will hold off on additional bicarb for now as ABG shows pH of 7.32 > reconsider once stat BMP results. -Stat repeat BMP w/ Mg & Phos  to monitor potassium improvement - Strict I/O's: alert provider if UOP < 0.5 mL/kg/hr -Continue IVF hydration as family reports poor p.o. intake at SNF due to AMS - Daily BMP, replace electrolytes PRN - Avoid nephrotoxic agents as able, ensure adequate renal perfusion  Acute encephalopathy on chronic vascular dementia in the setting of septic shock Multiple CVAs After recent stroke, per family patient's new baseline is alert and responsive but minimal verbal responses.  CT head negative for acute changes showing most recent strokes noted at [facility] during hospitalization in August 2022 -Frequent neurological checks per ICU routine -Hold sedating medications as tolerated > family concerned regarding pain control but understands that this will be a fine balance in the setting of the patient's altered mental status and hypotension, can consider very low-dose fentanyl IV if patient appears in pain.   - P.o. outpatient medication on hold as patient is unable to take p.o. meds safely: Donepezil, sertraline, oxycodone, gabapentin, tramadol & methocarbamol  Chronic Atrial Fibrillation-rate controlled PMHx: Hypertension, hyperlipidemia, HFpEF Patient is not on systemic anticoagulation due to history of multiple GI bleeds.  Patient is also not on any rate control medicine outpatient. - Continuous cardiac monitoring -P.o. outpatient medication on hold as patient is unable to take p.o. meds safely: Benazepril, atorvastatin  Poorly controlled Type 2 Diabetes Mellitus Hemoglobin A1C: Pending - Monitor CBG Q 4 hours - SSI moderate dosing - target range while in ICU: 140-180 - follow ICU hyper/hypo-glycemia protocol  Chronic bilateral heel, sacral & left toe pressure injuries and dry gangrene wounds to right second and fifth toes - Wound care consult placed to assist with wound care -Consider very low-dose fentanyl IV if patient appears in pain overnight.  Might be a good candidate for fentanyl topical  patches as mental status & hemodynamics allow - Turn and reposition with offloading measures every 2 hours -Prevalon boots for bilateral feet  Best Practice (right click and "Reselect all SmartList Selections" daily)  Diet/type: NPO DVT prophylaxis: prophylactic heparin  GI prophylaxis: PPI Lines: N/A Foley:  Yes, and it is still needed Code Status:  DNR Last date of multidisciplinary goals of care discussion [10/15/2020]  Labs   CBC: Recent Labs  Lab 10/15/20 1438  WBC 24.9*  NEUTROABS 22.5*  HGB 11.4*  HCT 36.5  MCV 87.1  PLT 425*    Basic Metabolic Panel: Recent Labs  Lab 10-15-2020 1438  NA 136  K 6.9*  CL 111  CO2 13*  GLUCOSE 313*  BUN 89*  CREATININE 1.90*  CALCIUM 10.5*   GFR: Estimated Creatinine Clearance: 18.4 mL/min (A) (by C-G formula based on SCr of 1.9 mg/dL (H)). Recent Labs  Lab 10-15-20 1438 2020-10-15 1439  WBC 24.9*  --   LATICACIDVEN  --  2.8*    Liver Function Tests: Recent Labs  Lab October 15, 2020 1438  AST 38  ALT 25  ALKPHOS 107  BILITOT 0.6  PROT 7.0  ALBUMIN 3.0*   No results for input(s): LIPASE, AMYLASE in the last 168  hours. No results for input(s): AMMONIA in the last 168 hours.  ABG    Component Value Date/Time   PHART 7.32 (L) 10/02/2020 1845   PCO2ART 23 (L) 10/06/2020 1845   PO2ART 109 (H) 09/18/2020 1845   HCO3 11.9 (L) 10/06/2020 1845   ACIDBASEDEF 12.0 (H) 09/12/2020 1845   O2SAT 97.8 09/08/2020 1845     Coagulation Profile: Recent Labs  Lab 09/23/2020 1439  INR 1.2    Cardiac Enzymes: No results for input(s): CKTOTAL, CKMB, CKMBINDEX, TROPONINI in the last 168 hours.  HbA1C: Hgb A1c MFr Bld  Date/Time Value Ref Range Status  02/20/2020 05:39 AM 6.5 (H) 4.8 - 5.6 % Final    Comment:    (NOTE) Pre diabetes:          5.7%-6.4%  Diabetes:              >6.4%  Glycemic control for   <7.0% adults with diabetes     CBG: No results for input(s): GLUCAP in the last 168 hours.  Review of Systems:    UTA-patient unable to participate in interview  Past Medical History:  She,  has a past medical history of Chronic a-fib (HCC), Chronic diastolic (congestive) heart failure (HCC), Diabetes mellitus without complication (HCC), DVT (deep venous thrombosis) (HCC), HTN (hypertension), Renal disorder, and Stroke (HCC).   Surgical History:   Past Surgical History:  Procedure Laterality Date   INTRAMEDULLARY (IM) NAIL INTERTROCHANTERIC Left 02/20/2020   Procedure: INTRAMEDULLARY (IM) NAIL INTERTROCHANTRIC;  Surgeon: Signa Kell, MD;  Location: [facility] ORS;  Service: Orthopedics;  Laterality: Left;     Social History:   reports that she has quit smoking. Her smoking use included cigarettes. She has never used smokeless tobacco. She reports that she does not currently use alcohol. She reports that she does not currently use drugs.   Family History:  Her family history includes CAD in her father; Hypertension in her father.   Allergies Allergies  Allergen Reactions   Apixaban Itching and Rash     Home Medications  Prior to Admission medications   Medication Sig Start Date End Date Taking? Authorizing Provider  acetaminophen (TYLENOL) 160 MG/5ML elixir Take 15 mg/kg by mouth 3 (three) times daily.   Yes [provider]  ascorbic acid (VITAMIN C) 500 MG tablet Take 1 tablet by mouth daily.   Yes [provider]  atorvastatin (LIPITOR) 40 MG tablet Take 40 mg by mouth at bedtime. 12/15/19  Yes [provider]  docusate sodium (COLACE) 100 MG capsule Take 1 capsule (100 mg total) by mouth 2 (two) times daily. Patient taking differently: Take 200 mg by mouth 2 (two) times daily. 02/23/20  Yes Lynn Ito, MD  gabapentin (NEURONTIN) 300 MG capsule Take 300 mg by mouth at bedtime. 09/19/20  Yes [provider]  ondansetron (ZOFRAN) 4 MG tablet Take 4 mg by mouth every 8 (eight) hours as needed. 09/19/20  Yes [provider]  oxyCODONE (OXY IR/ROXICODONE) 5  MG immediate release tablet Take 0.5-1 tablets (2.5-5 mg total) by mouth every 3 (three) hours as needed for moderate pain or severe pain (pain score 4-6). Patient taking differently: Take 5 mg by mouth every 4 (four) hours as needed for moderate pain or severe pain (pain score 4-6). 02/21/20  Yes Dedra Skeens, PA-C  sertraline (ZOLOFT) 100 MG tablet Take 100 mg by mouth daily. 09/19/20  Yes [provider]  sertraline (ZOLOFT) 50 MG tablet Take 50 mg by mouth daily.  Yes [provider]  acetaminophen (TYLENOL) 500 MG tablet Take 2 tablets (1,000 mg total) by mouth every 8 (eight) hours. Patient not taking: No sig reported 02/23/20   Lynn Ito, MD  benazepril (LOTENSIN) 40 MG tablet Take 40 mg by mouth daily. Patient not taking: Reported on 10-04-20 08/27/19   [provider]  bisacodyl (DULCOLAX) 10 MG suppository Place 1 suppository (10 mg total) rectally daily as needed for moderate constipation. Patient not taking: No sig reported 02/23/20   Lynn Ito, MD  Cholecalciferol 25 MCG (1000 UT) capsule Take 2,000 Units by mouth daily. Patient not taking: Reported on 2020-10-04 08/24/19   [provider]  donepezil (ARICEPT) 10 MG tablet Take 10 mg by mouth at bedtime. Patient not taking: Reported on October 04, 2020 01/14/20   [provider]  enoxaparin (LOVENOX) 40 MG/0.4ML injection Inject 0.4 mLs (40 mg total) into the skin daily for 14 doses. 02/21/20 03/06/20  Dedra Skeens, PA-C  Ensure Max Protein (ENSURE MAX PROTEIN) LIQD Take 330 mLs (11 oz total) by mouth daily. 02/24/20   Lynn Ito, MD  metFORMIN (GLUCOPHAGE-XR) 500 MG 24 hr tablet Take 500 mg by mouth daily with breakfast. Patient not taking: Reported on 2020-10-04 12/15/19   [provider]  methocarbamol (ROBAXIN) 500 MG tablet Take 1 tablet (500 mg total) by mouth every 8 (eight) hours as needed for muscle spasms. Patient not taking: No sig reported 02/23/20   Lynn Ito, MD  Multiple  Vitamin (MULTIVITAMIN WITH MINERALS) TABS tablet Take 1 tablet by mouth daily. Patient not taking: No sig reported 02/24/20   Lynn Ito, MD  nutrition supplement, JUVEN, (JUVEN) PACK Take 1 packet by mouth 2 (two) times daily between meals. 02/24/20   Lynn Ito, MD  omeprazole (PRILOSEC) 40 MG capsule Take 40 mg by mouth daily before breakfast. Patient not taking: Reported on 10/04/2020 12/15/19   [provider]  polyethylene glycol (MIRALAX / GLYCOLAX) 17 g packet Take 17 g by mouth daily. Patient not taking: No sig reported 02/24/20   Lynn Ito, MD  senna-docusate (SENOKOT-S) 8.6-50 MG tablet Take 1 tablet by mouth 2 (two) times daily. Patient not taking: Reported on 10/04/20    [provider]  traMADol (ULTRAM) 50 MG tablet Take 1 tablet (50 mg total) by mouth every 6 (six) hours as needed for moderate pain. Patient not taking: No sig reported 02/21/20   Dedra Skeens, PA-C     Critical care time: 50 minutes      Cheryll Cockayne Rust-Chester, AGACNP-BC Acute Care Nurse Practitioner [facility]   [phone] / [phone] Please see Amion for pager details.

## 2020-09-22 NOTE — ED Triage Notes (Signed)
Pt comes into the ED via [facility] from [facility].  Pt just got there on 9/13 from [facility] where she was hospitalized for 22 days due to stroke and hypotension.  PT has no been responsive the entire time she has been at the facility.  They received her labs back today which showed and elevated WBC and she is febrile.    101.5 axillary CBG 387 120 HR 98% RA 30-35 RR with ET at 14  Pt is Diabetic but not insulin dependent.

## 2020-09-22 NOTE — Progress Notes (Signed)
Pharmacy Antibiotic Note  Virginia Mann is a 80 y.o. female admitted on 10/03/2020 with  osteomyelitis .  Pharmacy has been consulted for Vancomycin, Cefepime dosing.  Pt appears to have AKI; SrCr is 1.65 on 9/16 up from baseline SrCr of 0.9.   Will dose by levels until renal function is stable.   Plan: Cefepime 2 gm IV X 1 given on 9/16 @ 1515. Cefepime 2 gm IV Q24H ordered to continue on 9/17 @ 1500.   Vancomycin 1 gm IV X 1 given on 9/16 @ 1800.   Random Vanc ordered for 9/17 @ 1800.   Height: 5\' 1"  (154.9 cm) Weight: 52.4 kg (115 lb 9.6 oz) IBW/kg (Calculated) : 47.8  Temp (24hrs), Avg:99.7 F (37.6 C), Min:99.1 F (37.3 C), Max:101.5 F (38.6 C)  Recent Labs  Lab 09/15/2020 1438 09/15/2020 1439 10/04/2020 2205  WBC 24.9*  --   --   CREATININE 1.90*  --  1.65*  LATICACIDVEN  --  2.8* 2.9*    Estimated Creatinine Clearance: 21.2 mL/min (A) (by C-G formula based on SCr of 1.65 mg/dL (H)).    Allergies  Allergen Reactions   Apixaban Itching and Rash    Antimicrobials this admission:   >>    >>   Dose adjustments this admission:   Microbiology results:  BCx:   UCx:    Sputum:    MRSA PCR:   Thank you for allowing pharmacy to be a part of this patient's care.  Cortez Steelman D 09/12/2020 11:23 PM

## 2020-09-22 NOTE — Sepsis Progress Note (Signed)
Code sepsis protocol being monitored by eLink. 

## 2020-09-22 NOTE — Consult Note (Signed)
CODE SEPSIS - PHARMACY COMMUNICATION  **Broad Spectrum Antibiotics should be administered within 1 hour of Sepsis diagnosis**  Time Code Sepsis Called/Page Received: 1456  Antibiotics Ordered: cefepime 2 grams x 1, vancomycin 1,000 mg x 1, metronidazole 500 mg x 1  Time of 1st antibiotic administration: 1515  Additional action taken by pharmacy: None  If necessary, Name of Provider/Nurse Contacted: N/a    Jaynie Bream, PharmD Pharmacy Resident  09/07/2020 3:34 PM

## 2020-09-23 DIAGNOSIS — E872 Acidosis, unspecified: Secondary | ICD-10-CM

## 2020-09-23 DIAGNOSIS — N179 Acute kidney failure, unspecified: Secondary | ICD-10-CM

## 2020-09-23 DIAGNOSIS — L8990 Pressure ulcer of unspecified site, unspecified stage: Secondary | ICD-10-CM | POA: Insufficient documentation

## 2020-09-23 DIAGNOSIS — A419 Sepsis, unspecified organism: Secondary | ICD-10-CM | POA: Diagnosis not present

## 2020-09-23 DIAGNOSIS — F0150 Vascular dementia without behavioral disturbance: Secondary | ICD-10-CM

## 2020-09-23 DIAGNOSIS — R6521 Severe sepsis with septic shock: Secondary | ICD-10-CM | POA: Diagnosis not present

## 2020-09-23 DIAGNOSIS — N189 Chronic kidney disease, unspecified: Secondary | ICD-10-CM

## 2020-09-23 DIAGNOSIS — M869 Osteomyelitis, unspecified: Secondary | ICD-10-CM

## 2020-09-23 DIAGNOSIS — G9340 Encephalopathy, unspecified: Secondary | ICD-10-CM

## 2020-09-23 LAB — BASIC METABOLIC PANEL
Anion gap: 8 (ref 5–15)
BUN: 84 mg/dL — ABNORMAL HIGH (ref 8–23)
CO2: 19 mmol/L — ABNORMAL LOW (ref 22–32)
Calcium: 9.5 mg/dL (ref 8.9–10.3)
Chloride: 114 mmol/L — ABNORMAL HIGH (ref 98–111)
Creatinine, Ser: 1.24 mg/dL — ABNORMAL HIGH (ref 0.44–1.00)
GFR, Estimated: 45 mL/min — ABNORMAL LOW (ref >60)
Glucose, Bld: 196 mg/dL — ABNORMAL HIGH (ref 70–99)
Potassium: 3.7 mmol/L (ref 3.5–5.1)
Sodium: 141 mmol/L (ref 135–145)

## 2020-09-23 LAB — MAGNESIUM: Magnesium: 2.1 mg/dL (ref 1.7–2.4)

## 2020-09-23 LAB — CBC
HCT: 27.5 % — ABNORMAL LOW (ref 36.0–46.0)
Hemoglobin: 8.5 g/dL — ABNORMAL LOW (ref 12.0–15.0)
MCH: 26.4 pg (ref 26.0–34.0)
MCHC: 30.9 g/dL (ref 30.0–36.0)
MCV: 85.4 fL (ref 80.0–100.0)
Platelets: 290 10*3/uL (ref 150–400)
RBC: 3.22 MIL/uL — ABNORMAL LOW (ref 3.87–5.11)
RDW: 19.9 % — ABNORMAL HIGH (ref 11.5–15.5)
WBC: 23.4 10*3/uL — ABNORMAL HIGH (ref 4.0–10.5)
nRBC: 0 % (ref 0.0–0.2)

## 2020-09-23 LAB — GLUCOSE, CAPILLARY
Glucose-Capillary: 192 mg/dL — ABNORMAL HIGH (ref 70–99)
Glucose-Capillary: 169 mg/dL — ABNORMAL HIGH (ref 70–99)
Glucose-Capillary: 200 mg/dL — ABNORMAL HIGH (ref 70–99)
Glucose-Capillary: 170 mg/dL — ABNORMAL HIGH (ref 70–99)

## 2020-09-23 LAB — PHOSPHORUS: Phosphorus: 4.1 mg/dL (ref 2.5–4.6)

## 2020-09-23 LAB — HEMOGLOBIN A1C
Hgb A1c MFr Bld: 6.2 % — ABNORMAL HIGH (ref 4.8–5.6)
Mean Plasma Glucose: 131.24 mg/dL

## 2020-09-23 LAB — MRSA NEXT GEN BY PCR, NASAL: MRSA by PCR Next Gen: NOT DETECTED

## 2020-09-23 LAB — LACTIC ACID, PLASMA: Lactic Acid, Venous: 2 mmol/L (ref 0.5–1.9)

## 2020-09-23 MED ORDER — HYDROMORPHONE HCL 1 MG/ML IJ SOLN
0.5000 mg | Freq: Once | INTRAMUSCULAR | Status: AC
  Administered 2020-09-23: 0.5 mg via INTRAVENOUS
  Filled 2020-09-23: qty 1

## 2020-09-23 MED ORDER — VANCOMYCIN HCL IN DEXTROSE 1-5 GM/200ML-% IV SOLN: 1000.0000 mg | INTRAVENOUS | Status: DC

## 2020-09-23 MED ORDER — SODIUM CHLORIDE 0.9 % IV BOLUS
500.0000 mL | Freq: Once | INTRAVENOUS | Status: AC
  Administered 2020-09-23: 500 mL via INTRAVENOUS

## 2020-09-23 MED ORDER — SODIUM CHLORIDE 0.9 % IV SOLN
3.0000 g | Freq: Two times a day (BID) | INTRAVENOUS | Status: DC
  Administered 2020-09-23 – 2020-09-24 (×2): 3 g via INTRAVENOUS
  Filled 2020-09-23 (×4): qty 8

## 2020-09-23 MED ORDER — ACETAMINOPHEN 650 MG RE SUPP: 650.0000 mg | RECTAL | Status: DC | PRN

## 2020-09-23 MED ORDER — MORPHINE SULFATE (PF) 2 MG/ML IV SOLN
1.0000 mg | Freq: Once | INTRAVENOUS | Status: DC
  Filled 2020-09-23: qty 1

## 2020-09-23 MED ORDER — CHLORHEXIDINE GLUCONATE CLOTH 2 % EX PADS
6.0000 | MEDICATED_PAD | Freq: Every day | CUTANEOUS | Status: DC
  Administered 2020-09-23 – 2020-09-26 (×4): 6 via TOPICAL

## 2020-09-23 NOTE — Plan of Care (Signed)

## 2020-09-23 NOTE — Progress Notes (Signed)
Pharmacy Antibiotic Note  Virginia Mann is a 80 y.o. female admitted on 09/21/2020 with  osteomyelitis .  Pharmacy has been consulted for Vancomycin, Cefepime dosing. Pt appears to have AKI that is now improving (baseline SCr < 1.0). Will dose by levels until renal function is stable.   Plan: Renal function improving  Start vancomycin 1000 mg IV Q 48 hrs based on current renal function  Goal AUC 400-550 Expected AUC: 476 SCr used: 1.24   Height: 5\' 1"  (154.9 cm) Weight: 52.4 kg (115 lb 9.6 oz) IBW/kg (Calculated) : 47.8  Temp (24hrs), Avg:99.6 F (37.6 C), Min:98.6 F (37 C), Max:101.5 F (38.6 C)  Recent Labs  Lab 09/21/2020 1438 09/21/2020 1439 10/02/2020 2205 09/23/20 0429  WBC 24.9*  --   --  23.4*  CREATININE 1.90*  --  1.65* 1.24*  LATICACIDVEN  --  2.8* 2.9*  --      Estimated Creatinine Clearance: 28.2 mL/min (A) (by C-G formula based on SCr of 1.24 mg/dL (H)).    Allergies  Allergen Reactions   Apixaban Itching and Rash    Antimicrobials this admission: Vancomycin 9/16 >> Cefepime 9/16 >>   Microbiology results: 9/16 BCx: NG 9/16 UCx: pending 9/16 MRSA PCR: negative  Thank you for allowing pharmacy to be a part of this patient's care.  10/16, PharmD, BCPS Clinical Pharmacist 09/23/2020 8:55 AM

## 2020-09-23 NOTE — Progress Notes (Signed)
   09/23/20 1117  Assess: MEWS Score  Temp 98.3 F (36.8 C)  BP (!) 136/45  Pulse Rate (!) 102  Resp (!) 28  SpO2 99 %  O2 Device Room Air  Assess: MEWS Score  MEWS Temp 0  MEWS Systolic 0  MEWS Pulse 1  MEWS RR 2  MEWS LOC 1  MEWS Score 4  MEWS Score Color Red  Assess: if the MEWS score is Yellow or Red  Were vital signs taken at a resting state? Yes  Focused Assessment No change from prior assessment  Does the patient meet 2 or more of the SIRS criteria? No  MEWS guidelines implemented *See Row Information* No, vital signs rechecked  Notify: Charge Nurse/RN  Name of Charge Nurse/RN Notified Harlen Labs RN,/ Thayer Ohm RN  Date Charge Nurse/RN Notified 09/23/20  Time Charge Nurse/RN Notified 1117  Document  Patient Outcome Other (Comment) (iv bolus, dilaudid)  Progress note created (see row info) Yes  Assess: SIRS CRITERIA  SIRS Temperature  0  SIRS Pulse 1  SIRS Respirations  1  SIRS WBC 0  SIRS Score Sum  2

## 2020-09-23 NOTE — H&P (Signed)
NAME:  Jai Bear, MRN:  [id number], DOB:  [birth year], LOS: 1 ADMISSION DATE:  09/19/2020, CONSULTATION DATE:  09/21/2020 REFERRING MD:  Dr. Vicente Males, CHIEF COMPLAINT:  AMS & fever   History of Present Illness:  80 year old female presenting to [facility] ED on 09/16/2020 from [facility] due to concerns for sepsis.  Per the patient's son and daughter who are at bedside she was recently hospitalized for 22 days at [facility] due to a stroke.  She was discharged to [facility] approximately 3 days ago and has had worsening altered mental status at the SNF.  Today the son became very concerned of developing sepsis after her lab work showed worsening leukocytosis in addition to the altered mental status and fever.  Of note the patient has been declining with 2 recent strokes and multiple chronic wounds including a sacral pressure injury as well as bilateral foot/heel wounds with surrounding erythema.  ED course: Initial vitals: Indicative of sepsis~febrile at 101.1, tachypneic 36, tachycardic 120 with marginal blood pressure 102/67 & SPO2 99% on room air.  Labs/ Imaging personally reviewed Significant labs: Na+ 136, K+6.9, AKI on CKD with BUN/Cr.:  89/1.9, NAGMA with serum CO2 13/AG 12, leukocytosis at 24.9 with left shift, lactic acidosis at 2.8, COVID-19/influenza A/B- negative, Hgb: 11.4, ABG: 7.32/ 23/ 109/ 11.9 CXR 10/02/2020: No active disease CT head 09/07/2020: The study is limited by extensive artifact. No gross intracranial hemorrhage is seen. Age indeterminate hypodensity/suspected small infarct within the white matter adjacent to the right frontal horn. Chronic right occipital infarct. Chronic appearing infarct at the right frontal vertex . Probable multiple chronic infarcts in the right cerebellum. Atrophy with chronic small-vessel ischemic changes of the white matter.  Patient worked up per septic protocol receiving 2.5 L of LR and empiric antibiotic coverage: Cefepime/Flagyl and vancomycin  for suspected source dry gangrene on right foot.  Post IV fluid resuscitation patient hypotensive requiring initiation of Levophed drip.  PCCM consulted for admission.  All history gathered from son and daughter who are bedside as the patient is minimally responsive.  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  The patient and family have declined antibiotics or surgical intervention during hospitalization in August 2022.  Per the family the patient's vascular doctor at [facility] offered amputation but was concerned about the degree of vascular disease, thinking the patient would ultimately end up with an AKA.  Per family the patient responded to this decision stating, " I would rather die than have my leg cut off."  Her children confirmed the patient changed her own CODE STATUS prior to this admission to DNR/DNI.  We had a long discussion regarding goals of care and while they would like to treat the treatable with antibiotics and peripheral vasopressors, they are not interested in surgical intervention for source control.  Further discussion revealed that if the patient's vasopressor requirements were too high for peripheral IV administration, family would like to rediscuss goals of care at that time and consider comfort care at that time.  Family is also in agreement not to place p.o. access at this time.  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 BASELINE FUNCTION:since most recent stroke in August is eye-opening with focusing, alert enough to participate in being fed, will respond with the number 4 if asked a pain scale and can move right extremities as well as left lower extremity minimally.  Per family she is unable to use left upper extremity since most recent stroke.  DNR/DNI  Objective   Blood pressure (!) 103/33, pulse 89, temperature (!) 100.4 F (38 C), resp. rate (!) 28, height 5\' 1"  (1.549 m), weight 52.4 kg, SpO2 100 %.        Intake/Output Summary (Last 24 hours) at 09/23/2020 0726 Last data filed at 09/23/2020 0600 Gross per 24 hour  Intake 5209.22 ml  Output 1075 ml  Net 4134.22 ml    Filed Weights   09/23/2020 1436  Weight: 52.4 kg    Review of Systems: LIMITED DUE TO CVA Other:  All other systems negative  PHYSICAL EXAMINATION:  GENERAL: ill appearing,  EYES: Pupils equal, round, reactive to light.  No scleral icterus.  MOUTH: Moist mucosal membrane. INTUBATED NECK: Supple.  PULMONARY: +rhonchi, +wheezing CARDIOVASCULAR: S1 and S2.  No murmurs  GASTROINTESTINAL: Soft, nontender, -distended. Positive bowel sounds.  MUSCULOSKELETAL: No swelling, clubbing, or edema.  NEUROLOGIC: unable to communicate SKIN:see pics below dry gangrene and chronic osteomyelitis in right second toe      Right foot pictured above  Left foot pictured above  Pressure Injury 09/17/2020 Coccyx Medial Stage 2 -  Partial thickness loss of dermis presenting as a shallow open injury with a red, pink wound bed without slough. (Active)  09/15/2020 2344  Location: Coccyx  Location Orientation: Medial  Staging: Stage 2 -  Partial thickness loss of dermis presenting as a shallow open injury with a red, pink wound bed without slough.  Wound Description (Comments):   Present on  Admission: Yes    Sacrum pictured above Extremities: Cool/dry, pulses + 2 R/ +1 P, no edema noted  Resolved Hospital Problem list     Assessment & Plan:  Admitted  and presenet on admission for Sepsis with septic shock due to suspected acute on chronic osteomyelitis and dry gangrene of right foot Possible UTI? Severe PAD with chronic osteomyelitis and dry gangrene  Off pressors Continue IV abx IVF's as needed  ACUTE KIDNEY INJURY/Renal Failure -continue Foley Catheter-assess need -Avoid nephrotoxic agents -Follow urine output, BMP -Ensure adequate renal perfusion, optimize oxygenation -Renal dose medications   Intake/Output Summary (Last 24 hours) at 09/23/2020 0730 Last data filed at 09/23/2020 0600 Gross per 24 hour  Intake 5209.22 ml  Output 1075 ml  Net 4134.22 ml     Acute encephalopathy on chronic vascular dementia in the setting of septic shock Multiple CVAs Poor prognosis Recommend hospice  ENDO - ICU hypoglycemic\Hyperglycemia protocol -check FSBS per protocol   Chronic bilateral heel, sacral & left toe pressure injuries and dry gangrene wounds to right second and fifth toes Follow up wound care consult  Best Practice (right click and "Reselect all SmartList Selections" daily)  Diet/type: NPO DVT prophylaxis: prophylactic heparin  GI prophylaxis: PPI Lines: N/A Foley:  Yes, and it is still needed Code Status:  DNR/DNI   Labs   CBC: Recent Labs  Lab 10/06/20 1438 09/23/20 0429  WBC 24.9* 23.4*  NEUTROABS 22.5*  --   HGB 11.4* 8.5*  HCT 36.5 27.5*  MCV 87.1 85.4  PLT 425* 290     Basic Metabolic Panel: Recent Labs  Lab 06-Oct-2020 1438 10-06-20 2205 09/23/20 0429  NA 136 141 141  K 6.9* 4.4 3.7  CL 111 113* 114*  CO2 13* 15* 19*  GLUCOSE 313* 208* 196*  BUN 89* 90* 84*  CREATININE 1.90* 1.65* 1.24*  CALCIUM 10.5* 10.2 9.5  MG  --  2.5* 2.1  PHOS  --  4.9* 4.1    GFR: Estimated Creatinine Clearance: 28.2 mL/min (A) (by C-G  formula based on SCr of 1.24 mg/dL (H)). Recent Labs  Lab 2020/10/06 1438 2020/10/06 1439 2020-10-06 2205 09/23/20 0429  WBC 24.9*  --   --  23.4*  LATICACIDVEN  --  2.8* 2.9*  --      Liver Function Tests: Recent Labs  Lab 10-06-2020 1438  AST 38  ALT 25  ALKPHOS 107  BILITOT 0.6  PROT 7.0  ALBUMIN 3.0*       Component Value Date/Time   PHART 7.32 (L) 10-06-20 1845   PCO2ART 23 (L) 10/06/2020 1845   PO2ART 109 (H) 10/06/2020 1845   HCO3 11.9 (L) 10-06-2020 1845   ACIDBASEDEF 12.0 (H) 10-06-20 1845   O2SAT 97.8 October 06, 2020 1845      Coagulation Profile: Recent Labs  Lab 2020-10-06 1439  INR 1.2   HbA1C: Hgb A1c MFr Bld  Date/Time Value Ref Range Status  2020/10/06 10:05 PM 6.2 (H) 4.8 - 5.6 % Final    Comment:    (NOTE) Pre diabetes:          5.7%-6.4%  Diabetes:              >6.4%  Glycemic control for   <7.0% adults with diabetes   02/20/2020 05:39 AM 6.5 (H) 4.8 - 5.6 % Final    Comment:    (NOTE) Pre diabetes:          5.7%-6.4%  Diabetes:              >6.4%  Glycemic control for   <7.0% adults with diabetes   Allergies Allergies  Allergen Reactions   Apixaban Itching and Rash     Home Medications  Prior to Admission medications   Medication Sig Start Date End Date Taking? Authorizing Provider  acetaminophen (TYLENOL) 160 MG/5ML elixir Take 15 mg/kg by mouth 3 (three) times daily.   Yes [provider]  ascorbic acid (VITAMIN C) 500 MG tablet Take 1 tablet by mouth daily.   Yes [provider]  atorvastatin (LIPITOR) 40 MG tablet Take 40 mg by mouth at bedtime. 12/15/19  Yes [provider]  docusate sodium (COLACE) 100 MG capsule Take 1 capsule (100 mg total) by mouth 2 (two) times daily. Patient taking differently: Take 200 mg by mouth 2 (two) times daily. 02/23/20  Yes Lynn Ito, MD  gabapentin (NEURONTIN) 300 MG capsule Take 300 mg by mouth at bedtime. 09/19/20  Yes [provider]  ondansetron  (ZOFRAN) 4 MG tablet Take 4 mg by mouth every 8 (eight) hours as needed. 09/19/20  Yes [provider]  oxyCODONE (OXY IR/ROXICODONE) 5 MG immediate release tablet Take 0.5-1 tablets (2.5-5 mg total) by mouth every 3 (three) hours as needed for moderate pain or severe pain (pain score 4-6). Patient taking differently: Take 5 mg by mouth every 4 (four) hours as  needed for moderate pain or severe pain (pain score 4-6). 02/21/20  Yes Dedra Skeens, PA-C  sertraline (ZOLOFT) 100 MG tablet Take 100 mg by mouth daily. 09/19/20  Yes [provider]  sertraline (ZOLOFT) 50 MG tablet Take 50 mg by mouth daily.   Yes [provider]  acetaminophen (TYLENOL) 500 MG tablet Take 2 tablets (1,000 mg total) by mouth every 8 (eight) hours. Patient not taking: No sig reported 02/23/20   Lynn Ito, MD  benazepril (LOTENSIN) 40 MG tablet Take 40 mg by mouth daily. Patient not taking: Reported on 12-Oct-2020 08/27/19   [provider]  bisacodyl (DULCOLAX) 10 MG suppository Place 1 suppository (10 mg total) rectally daily as needed for moderate constipation. Patient not taking: No sig reported 02/23/20   Lynn Ito, MD  Cholecalciferol 25 MCG (1000 UT) capsule Take 2,000 Units by mouth daily. Patient not taking: Reported on October 12, 2020 08/24/19   [provider]  donepezil (ARICEPT) 10 MG tablet Take 10 mg by mouth at bedtime. Patient not taking: Reported on 12-Oct-2020 01/14/20   [provider]  enoxaparin (LOVENOX) 40 MG/0.4ML injection Inject 0.4 mLs (40 mg total) into the skin daily for 14 doses. 02/21/20 03/06/20  Dedra Skeens, PA-C  Ensure Max Protein (ENSURE MAX PROTEIN) LIQD Take 330 mLs (11 oz total) by mouth daily. 02/24/20   Lynn Ito, MD  metFORMIN (GLUCOPHAGE-XR) 500 MG 24 hr tablet Take 500 mg by mouth daily with breakfast. Patient not taking: Reported on 10/12/2020 12/15/19   [provider]  methocarbamol (ROBAXIN) 500 MG tablet Take 1 tablet (500 mg  total) by mouth every 8 (eight) hours as needed for muscle spasms. Patient not taking: No sig reported 02/23/20   Lynn Ito, MD  Multiple Vitamin (MULTIVITAMIN WITH MINERALS) TABS tablet Take 1 tablet by mouth daily. Patient not taking: No sig reported 02/24/20   Lynn Ito, MD  nutrition supplement, JUVEN, (JUVEN) PACK Take 1 packet by mouth 2 (two) times daily between meals. 02/24/20   Lynn Ito, MD  omeprazole (PRILOSEC) 40 MG capsule Take 40 mg by mouth daily before breakfast. Patient not taking: Reported on 10-12-2020 12/15/19   [provider]  polyethylene glycol (MIRALAX / GLYCOLAX) 17 g packet Take 17 g by mouth daily. Patient not taking: No sig reported 02/24/20   Lynn Ito, MD  senna-docusate (SENOKOT-S) 8.6-50 MG tablet Take 1 tablet by mouth 2 (two) times daily. Patient not taking: Reported on October 12, 2020    [provider]  traMADol (ULTRAM) 50 MG tablet Take 1 tablet (50 mg total) by mouth every 6 (six) hours as needed for moderate pain. Patient not taking: No sig reported 02/21/20   Dedra Skeens, PA-C    TRANSFER TO [facility] NO ICU NEEDS AT THIS TIME    Lucie Leather, M.D.  [facility]  Medical Director [facility] Medical Director [facility] Cardio-Pulmonary Department

## 2020-09-23 NOTE — Progress Notes (Signed)
PHARMACY - PHYSICIAN COMMUNICATION CRITICAL VALUE ALERT - BLOOD CULTURE IDENTIFICATION (BCID)  Virginia Mann is an 80 y.o. female who presented to [facility] on 09/30/2020 with a chief complaint of sepsis  Assessment:  1/4(aerobic) Staph Species, Staph Epi Epidermidis  Name of physician (or Provider) Contacted: Kasa  Current antibiotics: Vancomycin, Cefepime, Flagyl  Changes to prescribed antibiotics recommended:  Stopping current antibiotics. Changing to Unasyn 3g Q12h. Will adjust per renal function.  Results for orders placed or performed during the hospital encounter of 09/28/2020  Blood Culture ID Panel (Reflexed) (Collected: 09/10/2020  2:38 PM)  Result Value Ref Range   Enterococcus faecalis NOT DETECTED NOT DETECTED   Enterococcus Faecium NOT DETECTED NOT DETECTED   Listeria monocytogenes NOT DETECTED NOT DETECTED   Staphylococcus species DETECTED (A) NOT DETECTED   Staphylococcus aureus (BCID) NOT DETECTED NOT DETECTED   Staphylococcus epidermidis DETECTED (A) NOT DETECTED   Staphylococcus lugdunensis NOT DETECTED NOT DETECTED   Streptococcus species NOT DETECTED NOT DETECTED   Streptococcus agalactiae NOT DETECTED NOT DETECTED   Streptococcus pneumoniae NOT DETECTED NOT DETECTED   Streptococcus pyogenes NOT DETECTED NOT DETECTED   A.calcoaceticus-baumannii NOT DETECTED NOT DETECTED   Bacteroides fragilis NOT DETECTED NOT DETECTED   Enterobacterales NOT DETECTED NOT DETECTED   Enterobacter cloacae complex NOT DETECTED NOT DETECTED   Escherichia coli NOT DETECTED NOT DETECTED   Klebsiella aerogenes NOT DETECTED NOT DETECTED   Klebsiella oxytoca NOT DETECTED NOT DETECTED   Klebsiella pneumoniae NOT DETECTED NOT DETECTED   Proteus species NOT DETECTED NOT DETECTED   Salmonella species NOT DETECTED NOT DETECTED   Serratia marcescens NOT DETECTED NOT DETECTED   Haemophilus influenzae NOT DETECTED NOT DETECTED   Neisseria meningitidis NOT DETECTED NOT DETECTED    Pseudomonas aeruginosa NOT DETECTED NOT DETECTED   Stenotrophomonas maltophilia NOT DETECTED NOT DETECTED   Candida albicans NOT DETECTED NOT DETECTED   Candida auris NOT DETECTED NOT DETECTED   Candida glabrata NOT DETECTED NOT DETECTED   Candida krusei NOT DETECTED NOT DETECTED   Candida parapsilosis NOT DETECTED NOT DETECTED   Candida tropicalis NOT DETECTED NOT DETECTED   Cryptococcus neoformans/gattii NOT DETECTED NOT DETECTED   Methicillin resistance mecA/C DETECTED (A) NOT DETECTED    [name] 09/23/2020  4:02 PM

## 2020-09-24 DIAGNOSIS — G9340 Encephalopathy, unspecified: Secondary | ICD-10-CM

## 2020-09-24 LAB — BASIC METABOLIC PANEL
Anion gap: 8 (ref 5–15)
BUN: 78 mg/dL — ABNORMAL HIGH (ref 8–23)
CO2: 21 mmol/L — ABNORMAL LOW (ref 22–32)
Calcium: 9.4 mg/dL (ref 8.9–10.3)
Chloride: 119 mmol/L — ABNORMAL HIGH (ref 98–111)
Creatinine, Ser: 1.02 mg/dL — ABNORMAL HIGH (ref 0.44–1.00)
GFR, Estimated: 56 mL/min — ABNORMAL LOW (ref >60)
Glucose, Bld: 117 mg/dL — ABNORMAL HIGH (ref 70–99)
Potassium: 3.6 mmol/L (ref 3.5–5.1)
Sodium: 148 mmol/L — ABNORMAL HIGH (ref 135–145)

## 2020-09-24 LAB — BLOOD CULTURE ID PANEL (REFLEXED) - BCID2

## 2020-09-24 LAB — CBC WITH DIFFERENTIAL/PLATELET
Abs Immature Granulocytes: 0.17 10*3/uL — ABNORMAL HIGH (ref 0.00–0.07)
Basophils Absolute: 0 10*3/uL (ref 0.0–0.1)
Basophils Relative: 0 %
Eosinophils Absolute: 0 10*3/uL (ref 0.0–0.5)
Eosinophils Relative: 0 %
HCT: 28.6 % — ABNORMAL LOW (ref 36.0–46.0)
Hemoglobin: 8.9 g/dL — ABNORMAL LOW (ref 12.0–15.0)
Immature Granulocytes: 1 %
Lymphocytes Relative: 3 %
Lymphs Abs: 0.6 10*3/uL — ABNORMAL LOW (ref 0.7–4.0)
MCH: 27.1 pg (ref 26.0–34.0)
MCHC: 31.1 g/dL (ref 30.0–36.0)
MCV: 87.2 fL (ref 80.0–100.0)
Monocytes Absolute: 1.2 10*3/uL — ABNORMAL HIGH (ref 0.1–1.0)
Monocytes Relative: 5 %
Neutro Abs: 21.1 10*3/uL — ABNORMAL HIGH (ref 1.7–7.7)
Neutrophils Relative %: 91 %
Platelets: 279 10*3/uL (ref 150–400)
RBC: 3.28 MIL/uL — ABNORMAL LOW (ref 3.87–5.11)
RDW: 20.2 % — ABNORMAL HIGH (ref 11.5–15.5)
Smear Review: NORMAL
WBC: 23.1 10*3/uL — ABNORMAL HIGH (ref 4.0–10.5)
nRBC: 0 % (ref 0.0–0.2)

## 2020-09-24 LAB — GLUCOSE, CAPILLARY
Glucose-Capillary: 116 mg/dL — ABNORMAL HIGH (ref 70–99)
Glucose-Capillary: 135 mg/dL — ABNORMAL HIGH (ref 70–99)
Glucose-Capillary: 147 mg/dL — ABNORMAL HIGH (ref 70–99)
Glucose-Capillary: 147 mg/dL — ABNORMAL HIGH (ref 70–99)
Glucose-Capillary: 154 mg/dL — ABNORMAL HIGH (ref 70–99)
Glucose-Capillary: 155 mg/dL — ABNORMAL HIGH (ref 70–99)
Glucose-Capillary: 195 mg/dL — ABNORMAL HIGH (ref 70–99)

## 2020-09-24 LAB — C DIFFICILE QUICK SCREEN W PCR REFLEX
C Diff antigen: NEGATIVE
C Diff interpretation: NOT DETECTED
C Diff toxin: NEGATIVE

## 2020-09-24 LAB — URINE CULTURE

## 2020-09-24 LAB — MAGNESIUM: Magnesium: 2.4 mg/dL (ref 1.7–2.4)

## 2020-09-24 LAB — PHOSPHORUS: Phosphorus: 3.8 mg/dL (ref 2.5–4.6)

## 2020-09-24 LAB — LACTIC ACID, PLASMA: Lactic Acid, Venous: 1.1 mmol/L (ref 0.5–1.9)

## 2020-09-24 MED ORDER — SODIUM CHLORIDE 0.9 % IV SOLN
2.0000 g | INTRAVENOUS | Status: DC
  Administered 2020-09-24: 2 g via INTRAVENOUS
  Filled 2020-09-24 (×2): qty 2

## 2020-09-24 MED ORDER — SODIUM CHLORIDE 0.9 % IV BOLUS
500.0000 mL | Freq: Once | INTRAVENOUS | Status: AC
  Administered 2020-09-24: 500 mL via INTRAVENOUS

## 2020-09-24 MED ORDER — METRONIDAZOLE 500 MG/100ML IV SOLN
500.0000 mg | Freq: Three times a day (TID) | INTRAVENOUS | Status: DC
  Administered 2020-09-24 – 2020-09-26 (×6): 500 mg via INTRAVENOUS
  Filled 2020-09-24 (×9): qty 100

## 2020-09-24 MED ORDER — VANCOMYCIN HCL IN DEXTROSE 1-5 GM/200ML-% IV SOLN
1000.0000 mg | INTRAVENOUS | Status: DC
  Administered 2020-09-24 – 2020-09-26 (×2): 1000 mg via INTRAVENOUS
  Filled 2020-09-24 (×3): qty 200

## 2020-09-24 NOTE — TOC Progression Note (Signed)
Transition of Care [facility]) - Progression Note    Patient Details  Name: Virginia Mann MRN: [id number] Date of Birth: Mar 12, 1940  Transition of Care [facility]) CM/SW Contact  Bing Quarry, RN Phone Number: 09/24/2020, 1:15 PM  Clinical Narrative:   Transfer to unit from ICU. Lethargic and minimally responsive per current nursing progress note.  Per provider notes, family discussion about wishes on further care. Palliative consult but no note visible as yet. Reached out to provider regarding need for comfort care measures. Patient made self a DNR per notes.  Anticipated hospital death likely per notes. Gabriel Cirri RN CM          Expected Discharge Plan and Services                                                 Social Determinants of Health (SDOH) Interventions    Readmission Risk Interventions No flowsheet data found.

## 2020-09-24 NOTE — Progress Notes (Signed)
Pharmacy Antibiotic Note  Virginia Mann is a 80 y.o. female admitted on 10-19-20 with  osteomyelitis .  Pharmacy has been consulted for Vancomycin, Cefepime dosing. Pt appears to have AKI that is now improving (baseline SCr < 1.0).  Plan: Creatinine decreasing  Start vancomycin 1250 mg IV q36h based on current renal function  Goal AUC 400-550 Expected AUC: 523 SCr used: 1.02  Cefepime 2 g IV q24h   Height: 5\' 1"  (154.9 cm) Weight: 53.8 kg (118 lb 9.7 oz) IBW/kg (Calculated) : 47.8  Temp (24hrs), Avg:98.1 F (36.7 C), Min:97.6 F (36.4 C), Max:98.6 F (37 C)  Recent Labs  Lab 19-Oct-2020 1438 19-Oct-2020 1439 Oct 19, 2020 2205 09/23/20 0429 09/23/20 1227 09/24/20 0526  WBC 24.9*  --   --  23.4*  --  23.1*  CREATININE 1.90*  --  1.65* 1.24*  --  1.02*  LATICACIDVEN  --  2.8* 2.9*  --  2.0*  --      Estimated Creatinine Clearance: 34.3 mL/min (A) (by C-G formula based on SCr of 1.02 mg/dL (H)).    Allergies  Allergen Reactions   Apixaban Itching and Rash    Antimicrobials this admission: Vancomycin 9/16 >> Cefepime 9/16 x 1, 9/18 >> Unasyn 9/17 >> 9/18   Microbiology results: 9/16 BCx: 1/4 methicillin resistant staph epi 9/16 UCx: pending 9/16 MRSA PCR: negative  Thank you for allowing pharmacy to be a part of this patient's care.  10/16, PharmD, BCPS Clinical Pharmacist 09/24/2020 8:47 AM

## 2020-09-24 NOTE — Plan of Care (Signed)

## 2020-09-24 NOTE — Progress Notes (Signed)
PROGRESS NOTE    Virginia Mann  OTL:[id number] DOB: December 09, 1940 DOA: 09/29/20 PCP: Lynwood Dawley, MD  Outpatient Specialists: cardiology, neurology, vascular surgery    Brief Narrative:  From admission hpi and ppcm transfer note: 80 year old female presenting to [facility] ED on Sep 29, 2020 from [facility] due to concerns for sepsis.  Per the patient's son and daughter who are at bedside she was recently hospitalized for 22 days at [facility] due to a stroke.  She was discharged to [facility] approximately 3 days ago and has had worsening altered mental status at the SNF.  Today the son became very concerned of developing sepsis after her lab work showed worsening leukocytosis in addition to the altered mental status and fever.  Of note the patient has been declining with 2 recent strokes and multiple chronic wounds including a sacral pressure injury as well as bilateral foot/heel wounds with surrounding erythema.    Patient worked up per septic protocol receiving 2.5 L of LR and empiric antibiotic coverage: Cefepime/Flagyl and vancomycin for suspected source dry gangrene on right foot.  Post IV fluid resuscitation patient hypotensive requiring initiation of Levophed drip.  PCCM consulted for admission.   All history gathered from son and daughter who are bedside as the patient is minimally responsive.  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  The patient and family have declined antibiotics or surgical intervention during hospitalization in August 2022.  Per the family the patient's vascular doctor at [facility] offered amputation but was concerned about the degree of vascular disease, thinking the patient would ultimately end up with an AKA.  Per family the patient responded to this decision stating, " I would rather die than have my leg cut off."  Her children confirmed the patient changed her own CODE STATUS prior to this  admission to DNR/DNI.  We had a long discussion regarding goals of care and while they would like to treat the treatable with antibiotics and peripheral vasopressors, they are not interested in surgical intervention for source control.  Further discussion revealed that if the patient's vasopressor requirements were too high for peripheral IV administration, family would like to rediscuss goals of care at that time and consider comfort care at that time.  Family is also in agreement not to place p.o. access at this time.   Assessment & Plan:   Active Problems:   Diabetes mellitus without complication (HCC)   Chronic a-fib (HCC)   Septic shock (HCC)   Pressure injury of skin   Acute kidney injury superimposed on CKD (HCC)   Vascular dementia (HCC)   Osteomyelitis of foot (HCC)   Metabolic acidosis, normal anion gap (NAG)   Acute encephalopathy  # End of life care DNR/DNI, no feeding tube, no central line. - family wants to continue antibiotics for now. Family understands patient is at the end of her life but wants to continue abx. Made aware patient is actively dying, that antibiotics will not resolve her infection.   # Diarrhea - check c diff   # Acute on chronic metabolic encephalopathy Multifactorial 2/2 remote and recent CVA, sepsis - treating as below  # Multiple lower extremity and sacral pressure ulcers - wound care consult  # Sepsis # Right foot ostemyelitis # Staph epidermidis bacteremia Here tachycardic, elevated lactate, elevated WBCs. Staph on blood cultures. Prognosis extremely poor. Now off pressors. Per ICU note, family wishes currently are abx, no  surgical intervention - broaden to vanc, cefepime, flagyl  - 500 cc bolus today - goals of care discussion w/ son  # Dementia # History CVA Admitted last month [facility] with CVA x2. Not discharged w/ anticoagulation or antiplatelet due to history of GI bleeds. Currently unresponsive.  # Acute kidney injury # CKD2 #  Hyperkalemia AKI and hyperkalemia resolved, kidney function at baseline  # PAD # History mesenteric ischemia # Renal artery stenosis # Dry gangrene Vascular interventions are limited, patient has previously declined amputation  # History multiple GI bleeds - no current signs bleeding  # Atrial fibrillation Off anticoagulation 2/2 recurrent GI bleeds  # T2DM Controlled off meds    DVT prophylaxis: heparin Code Status: DNR/DNI Family Communication: son updated telephonically 9/18  Level of care: Med-Surg Status is: Inpatient  Remains inpatient appropriate because:Inpatient level of care appropriate due to severity of illness  Dispo: The patient is from: SNF              Anticipated d/c is to: in-hospital death vs hospice              Patient currently is not medically stable to d/c.   Difficult to place patient No        Consultants:  none  Procedures: none  Antimicrobials:  Stop unasyn, start vanc/cefepime/flagyl    Subjective: asleep  Objective: Vitals:   09/24/20 0446 09/24/20 0500 09/24/20 0718 09/24/20 0719  BP: (!) 151/54  (!) 97/49 (!) 100/53  Pulse: (!) 104  (!) 111 (!) 110  Resp:   (!) 22 (!) 22  Temp: 97.6 F (36.4 C)  98.6 F (37 C) 98.6 F (37 C)  TempSrc: Oral     SpO2: 100%  99% 99%  Weight:  53.8 kg    Height:        Intake/Output Summary (Last 24 hours) at 09/24/2020 0758 Last data filed at 09/24/2020 0456 Gross per 24 hour  Intake 667.74 ml  Output 750 ml  Net -82.26 ml   Filed Weights   2020/10/05 1436 09/24/20 0500  Weight: 52.4 kg 53.8 kg    Examination:  General exam: Appears chronically ill appearing, not responsive Respiratory system: rales at bases Cardiovascular system: S1 & S2 heard, tachycardic, irreg Gastrointestinal system: soft Central nervous system: unconscious, rouses somewhat when examined Extremities: decreased muscle tone. Cool lower extremities Skin: sacral decubitus ulcers appear to be stage 2.  Bandaged right foot, photos show missing several digits with bone visible, lateral foot ulcers bilaterally Psychiatry: unable to assess    Data Reviewed: I have personally reviewed following labs and imaging studies  CBC: Recent Labs  Lab October 05, 2020 1438 09/23/20 0429 09/24/20 0526  WBC 24.9* 23.4* 23.1*  NEUTROABS 22.5*  --  21.1*  HGB 11.4* 8.5* 8.9*  HCT 36.5 27.5* 28.6*  MCV 87.1 85.4 87.2  PLT 425* 290 279   Basic Metabolic Panel: Recent Labs  Lab 2020-10-05 1438 10/05/2020 2205 09/23/20 0429 09/24/20 0526  NA 136 141 141 148*  K 6.9* 4.4 3.7 3.6  CL 111 113* 114* 119*  CO2 13* 15* 19* 21*  GLUCOSE 313* 208* 196* 117*  BUN 89* 90* 84* 78*  CREATININE 1.90* 1.65* 1.24* 1.02*  CALCIUM 10.5* 10.2 9.5 9.4  MG  --  2.5* 2.1 2.4  PHOS  --  4.9* 4.1 3.8   GFR: Estimated Creatinine Clearance: 34.3 mL/min (A) (by C-G formula based on SCr of 1.02 mg/dL (H)). Liver Function Tests: Recent Labs  Lab  2020-10-13 1438  AST 38  ALT 25  ALKPHOS 107  BILITOT 0.6  PROT 7.0  ALBUMIN 3.0*   No results for input(s): LIPASE, AMYLASE in the last 168 hours. No results for input(s): AMMONIA in the last 168 hours. Coagulation Profile: Recent Labs  Lab October 13, 2020 1439  INR 1.2   Cardiac Enzymes: No results for input(s): CKTOTAL, CKMB, CKMBINDEX, TROPONINI in the last 168 hours. BNP (last 3 results) No results for input(s): PROBNP in the last 8760 hours. HbA1C: Recent Labs    10-13-20 2205  HGBA1C 6.2*   CBG: Recent Labs  Lab 09/23/20 1750 09/23/20 2045 09/24/20 0006 09/24/20 0440 09/24/20 0721  GLUCAP 200* 170* 154* 116* 135*   Lipid Profile: No results for input(s): CHOL, HDL, LDLCALC, TRIG, CHOLHDL, LDLDIRECT in the last 72 hours. Thyroid Function Tests: No results for input(s): TSH, T4TOTAL, FREET4, T3FREE, THYROIDAB in the last 72 hours. Anemia Panel: No results for input(s): VITAMINB12, FOLATE, FERRITIN, TIBC, IRON, RETICCTPCT in the last 72 hours. Urine  analysis:    Component Value Date/Time   COLORURINE YELLOW 13-Oct-2020 2317   APPEARANCEUR CLOUDY (A) 2020-10-13 2317   LABSPEC 1.020 2020-10-13 2317   PHURINE 5.0 10-13-2020 2317   GLUCOSEU NEGATIVE 2020-10-13 2317   HGBUR LARGE (A) 10/13/20 2317   BILIRUBINUR NEGATIVE 10/13/2020 2317   KETONESUR NEGATIVE Oct 13, 2020 2317   PROTEINUR 100 (A) 10/13/2020 2317   NITRITE NEGATIVE 10/13/20 2317   LEUKOCYTESUR MODERATE (A) 10-13-2020 2317   Sepsis Labs: @LABRCNTIP (procalcitonin:4,lacticidven:4)  ) Recent Results (from the past 240 hour(s))  Culture, blood (Routine x 2)     Status: None (Preliminary result)   Collection Time: 10/13/2020  2:38 PM   Specimen: BLOOD  Result Value Ref Range Status   Specimen Description   Final    BLOOD BLOOD LEFT HAND Performed at [facility], [address], [place], [place]    Special Requests   Final    BOTTLES DRAWN AEROBIC AND ANAEROBIC Blood Culture adequate volume Performed at [facility], [address] [place], [place]    Culture  Setup Time   Final    GRAM POSITIVE COCCI IN CLUSTERS AEROBIC BOTTLE ONLY Organism ID to follow CRITICAL RESULT CALLED TO, READ BACK BY AND VERIFIED WITH: WALID NAZARI 1524 09/23/20 AMK    Culture GRAM POSITIVE COCCI IN CLUSTERS  Final   Report Status PENDING  Incomplete  Blood Culture ID Panel (Reflexed)     Status: Abnormal   Collection Time: 10-13-20  2:38 PM  Result Value Ref Range Status   Enterococcus faecalis NOT DETECTED NOT DETECTED Final   Enterococcus Faecium NOT DETECTED NOT DETECTED Final   Listeria monocytogenes NOT DETECTED NOT DETECTED Final   Staphylococcus species DETECTED (A) NOT DETECTED Final    Comment: CRITICAL RESULT CALLED TO, READ BACK BY AND VERIFIED WITH: WALID NAZARI 1524 09/23/20 AMK    Staphylococcus aureus (BCID) NOT DETECTED NOT DETECTED Final   Staphylococcus epidermidis DETECTED (A) NOT DETECTED Final    Comment: Methicillin  (oxacillin) resistant coagulase negative staphylococcus. Possible blood culture contaminant (unless isolated from more than one blood culture draw or clinical case suggests pathogenicity). No antibiotic treatment is indicated for blood  culture contaminants. CRITICAL RESULT CALLED TO, READ BACK BY AND VERIFIED WITH: WALID NAZARI 1524 09/23/20 AMK    Staphylococcus lugdunensis NOT DETECTED NOT DETECTED Final   Streptococcus species NOT DETECTED NOT DETECTED Final   Streptococcus agalactiae NOT DETECTED NOT DETECTED Final   Streptococcus  pneumoniae NOT DETECTED NOT DETECTED Final   Streptococcus pyogenes NOT DETECTED NOT DETECTED Final   A.calcoaceticus-baumannii NOT DETECTED NOT DETECTED Final   Bacteroides fragilis NOT DETECTED NOT DETECTED Final   Enterobacterales NOT DETECTED NOT DETECTED Final   Enterobacter cloacae complex NOT DETECTED NOT DETECTED Final   Escherichia coli NOT DETECTED NOT DETECTED Final   Klebsiella aerogenes NOT DETECTED NOT DETECTED Final   Klebsiella oxytoca NOT DETECTED NOT DETECTED Final   Klebsiella pneumoniae NOT DETECTED NOT DETECTED Final   Proteus species NOT DETECTED NOT DETECTED Final   Salmonella species NOT DETECTED NOT DETECTED Final   Serratia marcescens NOT DETECTED NOT DETECTED Final   Haemophilus influenzae NOT DETECTED NOT DETECTED Final   Neisseria meningitidis NOT DETECTED NOT DETECTED Final   Pseudomonas aeruginosa NOT DETECTED NOT DETECTED Final   Stenotrophomonas maltophilia NOT DETECTED NOT DETECTED Final   Candida albicans NOT DETECTED NOT DETECTED Final   Candida auris NOT DETECTED NOT DETECTED Final   Candida glabrata NOT DETECTED NOT DETECTED Final   Candida krusei NOT DETECTED NOT DETECTED Final   Candida parapsilosis NOT DETECTED NOT DETECTED Final   Candida tropicalis NOT DETECTED NOT DETECTED Final   Cryptococcus neoformans/gattii NOT DETECTED NOT DETECTED Final   Methicillin resistance mecA/C DETECTED (A) NOT DETECTED Final     Comment: CRITICAL RESULT CALLED TO, READ BACK BY AND VERIFIED WITH: Mila Merry 1524 09/23/20 AMK Performed at [facility], [address] [place], [place] [address]   Culture, blood (Routine x 2)     Status: None (Preliminary result)   Collection Time: 09/16/2020  3:24 PM   Specimen: BLOOD  Result Value Ref Range Status   Specimen Description BLOOD RIGHT ANTECUBITAL  Final   Special Requests   Final    BOTTLES DRAWN AEROBIC AND ANAEROBIC Blood Culture adequate volume   Culture   Final    NO GROWTH 2 DAYS Performed at [facility], [address], [place], [place] [address]    Report Status PENDING  Incomplete  Resp Panel by RT-PCR (Flu A&B, Covid) Nasopharyngeal Swab     Status: None   Collection Time: 09/28/2020  3:24 PM   Specimen: Nasopharyngeal Swab; Nasopharyngeal(NP) swabs in vial transport medium  Result Value Ref Range Status   SARS Coronavirus 2 by RT PCR NEGATIVE NEGATIVE Final    Comment: (NOTE) SARS-CoV-2 target nucleic acids are NOT DETECTED.  The SARS-CoV-2 RNA is generally detectable in upper respiratory specimens during the acute phase of infection. The lowest concentration of SARS-CoV-2 viral copies this assay can detect is 138 copies/mL. A negative result does not preclude SARS-Cov-2 infection and should not be used as the sole basis for treatment or other patient management decisions. A negative result may occur with  improper specimen collection/handling, submission of specimen other than nasopharyngeal swab, presence of viral mutation(s) within the areas targeted by this assay, and inadequate number of viral copies(<138 copies/mL). A negative result must be combined with clinical observations, patient history, and epidemiological information. The expected result is Negative.  Fact Sheet for Patients:  BloggerCourse.com  Fact Sheet for Healthcare Providers:  SeriousBroker.it  This test  is no t yet approved or cleared by the [place] FDA and  has been authorized for detection and/or diagnosis of SARS-CoV-2 by FDA under an Emergency Use Authorization (EUA). This EUA will remain  in effect (meaning this test can be used) for the duration of the COVID-19 declaration under Section 564(b)(1) of the Act, 21 U.S.C.section 360bbb-3(b)(1),  unless the authorization is terminated  or revoked sooner.       Influenza A by PCR NEGATIVE NEGATIVE Final   Influenza B by PCR NEGATIVE NEGATIVE Final    Comment: (NOTE) The Xpert Xpress SARS-CoV-2/FLU/RSV plus assay is intended as an aid in the diagnosis of influenza from Nasopharyngeal swab specimens and should not be used as a sole basis for treatment. Nasal washings and aspirates are unacceptable for Xpert Xpress SARS-CoV-2/FLU/RSV testing.  Fact Sheet for Patients: BloggerCourse.com  Fact Sheet for Healthcare Providers: SeriousBroker.it  This test is not yet approved or cleared by the [place] FDA and has been authorized for detection and/or diagnosis of SARS-CoV-2 by FDA under an Emergency Use Authorization (EUA). This EUA will remain in effect (meaning this test can be used) for the duration of the COVID-19 declaration under Section 564(b)(1) of the Act, 21 U.S.C. section 360bbb-3(b)(1), unless the authorization is terminated or revoked.  Performed at [facility], [address] [place], [place] [address]   MRSA Next Gen by PCR, Nasal     Status: None   Collection Time: 10-13-20 11:06 PM   Specimen: Nasal Mucosa; Nasal Swab  Result Value Ref Range Status   MRSA by PCR Next Gen NOT DETECTED NOT DETECTED Final    Comment: (NOTE) The GeneXpert MRSA Assay (FDA approved for NASAL specimens only), is one component of a comprehensive MRSA colonization surveillance program. It is not intended to diagnose MRSA infection nor to guide or monitor treatment for  MRSA infections. Test performance is not FDA approved in patients less than 25 years old. Performed at [facility], [address] [place], [place] [address]          Radiology Studies: DG Chest 1 View  Result Date: 2020/10/13 CLINICAL DATA:  Sepsis. EXAM: CHEST  1 VIEW COMPARISON:  Chest x-ray 02/19/2020 FINDINGS: The heart and mediastinal contours are unchanged. Aortic calcification. No focal consolidation. No pulmonary edema. No pleural effusion. No pneumothorax. No acute osseous abnormality. IMPRESSION: No active disease. Electronically Signed   By: Tish Frederickson M.D.   On: 13-Oct-2020 15:54   CT Head Wo Contrast  Result Date: 13-Oct-2020 CLINICAL DATA:  Mental status change EXAM: CT HEAD WITHOUT CONTRAST TECHNIQUE: Contiguous axial images were obtained from the base of the skull through the vertex without intravenous contrast. COMPARISON:  CT 02/19/2020 FINDINGS: Brain: Limited by extensive artifact with obscured posterior fossa, posterior temporal lobes and occipital lobes. No gross hemorrhage or midline shift. 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 chronic right cerebellar infarcts. Interval encephalomalacia at the right posterior frontal vertex, series 3, image 63 consistent with chronic infarct, but new as compared with February CT. Age indeterminate hypodensity within the white matter adjacent to the frontal horn, series 3, image 41. Moderate severe atrophy with chronic small vessel ischemic changes of the white matter. Nonenlarged ventricles. Vascular: Slightly limited assessment due to artifact. Vertebral and carotid vascular calcification. Skull: No fracture Sinuses/Orbits: Mucosal thickening in the sinuses Other: None IMPRESSION: 1. The study is limited by extensive artifact. No gross intracranial hemorrhage is seen 2. Age indeterminate hypodensity/suspected small infarct within the white matter adjacent to the right frontal horn, new since February CT. 3.  Chronic right occipital infarct. Chronic appearing infarct at the right frontal vertex but new since February 2022. Probable multiple chronic infarcts in the right cerebellum 4. Atrophy with chronic small-vessel ischemic changes of the white matter Electronically Signed   By: Jasmine Pang M.D.   On:  09/12/2020 17:17   DG Foot Complete Left  Result Date: 09/10/2020 CLINICAL DATA:  Chronic wounds, fever and elevated white cell count. EXAM: LEFT FOOT - COMPLETE 3+ VIEW COMPARISON:  None. FINDINGS: Diffuse bone demineralization. Degenerative changes in the interphalangeal joints. Old healed fracture deformity of the fifth metatarsal bone. No acute fractures identified. No focal bone erosion or sclerosis to suggest osteomyelitis. Soft tissue lucencies over the calcaneus likely representing ulcerations. No soft tissue gas or radiopaque foreign body. Prominent vascular calcifications. IMPRESSION: Diffuse bone demineralization and degenerative changes. Old healed fracture of the fifth metatarsal. No acute bony abnormalities identified. Electronically Signed   By: Burman Nieves M.D.   On: 10/03/2020 21:38   DG Foot Complete Right  Result Date: 09/28/2020 CLINICAL DATA:  Chronic wounds. Fevers and elevated white cell count. EXAM: RIGHT FOOT COMPLETE - 3+ VIEW COMPARISON:  None. FINDINGS: Diffuse bone demineralization. Degenerative changes in the interphalangeal joints. Toes are fixed in flexion, limiting evaluation. Possible hammertoe deformities. There is evidence of acral osteolysis at the distal phalanx of the first toe. Bone lucency and cortical erosion along the second metatarsal head. Changes may indicate areas of osteomyelitis. No soft tissue gas or radiopaque soft tissue foreign bodies. Vascular calcifications. IMPRESSION: 1. Diffuse bone demineralization and degenerative changes. 2. 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 Lucency with cortical erosion at the second metatarsal head. These changes may  indicate osteomyelitis in the appropriate clinical setting. Electronically Signed   By: Burman Nieves M.D.   On: 10/04/2020 21:40        Scheduled Meds:  Chlorhexidine Gluconate Cloth  6 each Topical Daily   heparin  5,000 Units Subcutaneous Q8H   insulin aspart  0-15 Units Subcutaneous Q4H   Continuous Infusions:  ampicillin-sulbactam (UNASYN) IV 3 g (09/24/20 0454)   sodium chloride       LOS: 2 days    Time spent: 55 min    Silvano Bilis, MD [facility]   If 7PM-7AM, please contact night-coverage [email or web address] Password [facility] 09/24/2020, 7:58 AM

## 2020-09-24 NOTE — Progress Notes (Signed)
Patient lethargic with assess, responds to painful stimuli. Respirations shallow and irregular, remains in yellow MEWS. Orders received for pain medication due to grimaces and moaning. Antibiotics administered per orders. Patient overall health status continues to decline as shift progress. Will continue to monitor.

## 2020-09-25 DIAGNOSIS — M86371 Chronic multifocal osteomyelitis, right ankle and foot: Secondary | ICD-10-CM

## 2020-09-25 DIAGNOSIS — Z7189 Other specified counseling: Secondary | ICD-10-CM

## 2020-09-25 DIAGNOSIS — Z515 Encounter for palliative care: Secondary | ICD-10-CM | POA: Diagnosis not present

## 2020-09-25 LAB — CBC
HCT: 29 % — ABNORMAL LOW (ref 36.0–46.0)
Hemoglobin: 8.9 g/dL — ABNORMAL LOW (ref 12.0–15.0)
MCH: 26.8 pg (ref 26.0–34.0)
MCHC: 30.7 g/dL (ref 30.0–36.0)
MCV: 87.3 fL (ref 80.0–100.0)
Platelets: 282 10*3/uL (ref 150–400)
RBC: 3.32 MIL/uL — ABNORMAL LOW (ref 3.87–5.11)
RDW: 20.5 % — ABNORMAL HIGH (ref 11.5–15.5)
WBC: 21.9 10*3/uL — ABNORMAL HIGH (ref 4.0–10.5)
nRBC: 0.2 % (ref 0.0–0.2)

## 2020-09-25 LAB — GLUCOSE, CAPILLARY
Glucose-Capillary: 118 mg/dL — ABNORMAL HIGH (ref 70–99)
Glucose-Capillary: 122 mg/dL — ABNORMAL HIGH (ref 70–99)
Glucose-Capillary: 131 mg/dL — ABNORMAL HIGH (ref 70–99)
Glucose-Capillary: 135 mg/dL — ABNORMAL HIGH (ref 70–99)
Glucose-Capillary: 136 mg/dL — ABNORMAL HIGH (ref 70–99)
Glucose-Capillary: 151 mg/dL — ABNORMAL HIGH (ref 70–99)
Glucose-Capillary: 152 mg/dL — ABNORMAL HIGH (ref 70–99)

## 2020-09-25 LAB — BASIC METABOLIC PANEL
Anion gap: 7 (ref 5–15)
BUN: 75 mg/dL — ABNORMAL HIGH (ref 8–23)
CO2: 20 mmol/L — ABNORMAL LOW (ref 22–32)
Calcium: 9.2 mg/dL (ref 8.9–10.3)
Chloride: 125 mmol/L — ABNORMAL HIGH (ref 98–111)
Creatinine, Ser: 0.92 mg/dL (ref 0.44–1.00)
GFR, Estimated: >60 mL/min (ref >60)
Glucose, Bld: 145 mg/dL — ABNORMAL HIGH (ref 70–99)
Potassium: 3.4 mmol/L — ABNORMAL LOW (ref 3.5–5.1)
Sodium: 152 mmol/L — ABNORMAL HIGH (ref 135–145)

## 2020-09-25 MED ORDER — SODIUM CHLORIDE 0.9 % IV SOLN
2.0000 g | Freq: Two times a day (BID) | INTRAVENOUS | Status: DC
  Administered 2020-09-25 – 2020-09-26 (×3): 2 g via INTRAVENOUS
  Filled 2020-09-25 (×5): qty 2

## 2020-09-25 MED ORDER — HYDROMORPHONE HCL 1 MG/ML IJ SOLN
0.5000 mg | INTRAMUSCULAR | Status: DC | PRN
  Administered 2020-09-25: 0.5 mg via INTRAVENOUS
  Filled 2020-09-25: qty 1

## 2020-09-25 MED ORDER — POTASSIUM CHLORIDE IN NACL 20-0.45 MEQ/L-% IV SOLN
INTRAVENOUS | Status: DC
  Filled 2020-09-25 (×7): qty 1000

## 2020-09-25 NOTE — Progress Notes (Addendum)
PROGRESS NOTE    Virginia Mann  WUX:[id number] DOB: 08-Feb-1940 DOA: 09/26/2020 PCP: Lynwood Dawley, MD  Outpatient Specialists: cardiology, neurology, vascular surgery    Brief Narrative:  From admission hpi and ppcm transfer note: 80 year old female presenting to [facility] ED on 09/29/2020 from [facility] due to concerns for sepsis.  Per the patient's son and daughter who are at bedside she was recently hospitalized for 22 days at [facility] due to a stroke.  She was discharged to [facility] approximately 3 days ago and has had worsening altered mental status at the SNF.  Today the son became very concerned of developing sepsis after her lab work showed worsening leukocytosis in addition to the altered mental status and fever.  Of note the patient has been declining with 2 recent strokes and multiple chronic wounds including a sacral pressure injury as well as bilateral foot/heel wounds with surrounding erythema.    Patient worked up per septic protocol receiving 2.5 L of LR and empiric antibiotic coverage: Cefepime/Flagyl and vancomycin for suspected source dry gangrene on right foot.  Post IV fluid resuscitation patient hypotensive requiring initiation of Levophed drip.  PCCM consulted for admission.   All history gathered from son and daughter who are bedside as the patient is minimally responsive.  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  The patient and family have declined antibiotics or surgical intervention during hospitalization in August 2022.  Per the family the patient's vascular doctor at [facility] offered amputation but was concerned about the degree of vascular disease, thinking the patient would ultimately end up with an AKA.  Per family the patient responded to this decision stating, " I would rather die than have my leg cut off."  Her children confirmed the patient changed her own CODE STATUS prior to this  admission to DNR/DNI.  We had a long discussion regarding goals of care and while they would like to treat the treatable with antibiotics and peripheral vasopressors, they are not interested in surgical intervention for source control.  Further discussion revealed that if the patient's vasopressor requirements were too high for peripheral IV administration, family would like to rediscuss goals of care at that time and consider comfort care at that time.  Family is also in agreement not to place p.o. access at this time.   Assessment & Plan:   Active Problems:   Diabetes mellitus without complication (HCC)   Chronic a-fib (HCC)   Septic shock (HCC)   Pressure injury of skin   Acute kidney injury superimposed on CKD (HCC)   Vascular dementia (HCC)   Osteomyelitis of foot (HCC)   Metabolic acidosis, normal anion gap (NAG)   Acute encephalopathy  # End of life care DNR/DNI, no feeding tube, no central line. - family wants to continue antibiotics for now. Family understands patient is at the end of her life but wants to continue abx. Made aware patient is actively dying, that antibiotics will not resolve her infection. Patient appears to be suffering. - palliative consult today, tentative plan transition to to comfort care tomorrow if no clinical improvement  # Diarrhea C diff neg  # Acute on chronic metabolic encephalopathy Multifactorial 2/2 remote and recent CVA, sepsis - treating as below  # Multiple lower extremity and sacral pressure ulcers - wound care consulted  # Sepsis # Right foot ostemyelitis # Staph epidermidis bacteremia Here tachycardic, elevated lactate, elevated WBCs. Staph  on blood cultures. Prognosis extremely poor. Now off pressors. 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 Not discharged w/ anticoagulation or antiplatelet due to history of GI bleeds. Currently unresponsive.  # Acute kidney injury # CKD2 # Hyperkalemia AKI and hyperkalemia resolved, kidney function at baseline  # PAD # History mesenteric ischemia # Renal artery stenosis # Dry gangrene Vascular interventions are limited, patient has previously declined amputation  # History multiple GI bleeds - no current signs bleeding  # Atrial fibrillation Off anticoagulation 2/2 recurrent GI bleeds  # T2DM Controlled off meds    DVT prophylaxis: heparin Code Status: DNR/DNI Family Communication: son updated telephonically 9/18  Level of care: Med-Surg Status is: Inpatient  Remains inpatient appropriate because:Inpatient level of care appropriate due to severity of illness  Dispo: The patient is from: SNF              Anticipated d/c is to: in-hospital death vs hospice              Patient currently is not medically stable to d/c.   Difficult to place patient No        Consultants:  none  Procedures: none  Antimicrobials:  Stopped unasyn, started vanc/cefepime/flagyl    Subjective: asleep  Objective: Vitals:   09/24/20 1733 09/24/20 2112 09/25/20 0433 09/25/20 0500  BP: (!) 168/57 (!) 117/49 114/74   Pulse: 93 94 (!) 105   Resp: (!) 21 17 20    Temp: 97.9 F (36.6 C) 98.5 F (36.9 C) 97.6 F (36.4 C)   TempSrc:  Axillary Oral   SpO2: 100% 99% 100%   Weight:    52.6 kg  Height:        Intake/Output Summary (Last 24 hours) at 09/25/2020 1048 Last data filed at 09/25/2020 0420 Gross per 24 hour  Intake --  Output 1300 ml  Net -1300 ml   Filed Weights   09/26/2020 1436 09/24/20 0500 09/25/20 0500  Weight: 52.4 kg 53.8 kg 52.6 kg    Examination:  General exam: Appears chronically ill appearing, not responsive Respiratory system: rales at bases Cardiovascular system: S1 & S2 heard, tachycardic,  irreg Gastrointestinal system: soft Central nervous system: unconscious, rouses somewhat when examined Extremities: decreased muscle tone. Cool lower extremities Skin: sacral decubitus ulcers appear to be stage 2. Bandaged right foot, photos show missing several digits with bone visible, lateral foot ulcers bilaterally Psychiatry: unable to assess    Data Reviewed: I have personally reviewed following labs and imaging studies  CBC: Recent Labs  Lab 09/15/2020 1438 09/23/20 0429 09/24/20 0526 09/25/20 0421  WBC 24.9* 23.4* 23.1* 21.9*  NEUTROABS 22.5*  --  21.1*  --   HGB 11.4* 8.5* 8.9* 8.9*  HCT 36.5 27.5* 28.6* 29.0*  MCV 87.1 85.4 87.2 87.3  PLT 425* 290 279 282   Basic Metabolic Panel: Recent Labs  Lab 09/17/2020 1438 09/20/2020 2205 09/23/20 0429 09/24/20 0526 09/25/20 0421  NA 136 141 141 148* 152*  K 6.9* 4.4 3.7 3.6 3.4*  CL 111 113* 114* 119* 125*  CO2 13* 15* 19* 21* 20*  GLUCOSE 313* 208* 196* 117* 145*  BUN 89* 90* 84* 78* 75*  CREATININE 1.90* 1.65* 1.24* 1.02* 0.92  CALCIUM 10.5* 10.2  9.5 9.4 9.2  MG  --  2.5* 2.1 2.4  --   PHOS  --  4.9* 4.1 3.8  --    GFR: Estimated Creatinine Clearance: 38 mL/min (by C-G formula based on SCr of 0.92 mg/dL). Liver Function Tests: Recent Labs  Lab 09/08/2020 1438  AST 38  ALT 25  ALKPHOS 107  BILITOT 0.6  PROT 7.0  ALBUMIN 3.0*   No results for input(s): LIPASE, AMYLASE in the last 168 hours. No results for input(s): AMMONIA in the last 168 hours. Coagulation Profile: Recent Labs  Lab 09/16/2020 1439  INR 1.2   Cardiac Enzymes: No results for input(s): CKTOTAL, CKMB, CKMBINDEX, TROPONINI in the last 168 hours. BNP (last 3 results) No results for input(s): PROBNP in the last 8760 hours. HbA1C: Recent Labs    09/21/2020 2205  HGBA1C 6.2*   CBG: Recent Labs  Lab 09/24/20 2114 09/24/20 2136 09/25/20 0016 09/25/20 0443 09/25/20 0838  GLUCAP 155* 147* 131* 136* 122*   Lipid Profile: No results for  input(s): CHOL, HDL, LDLCALC, TRIG, CHOLHDL, LDLDIRECT in the last 72 hours. Thyroid Function Tests: No results for input(s): TSH, T4TOTAL, FREET4, T3FREE, THYROIDAB in the last 72 hours. Anemia Panel: No results for input(s): VITAMINB12, FOLATE, FERRITIN, TIBC, IRON, RETICCTPCT in the last 72 hours. Urine analysis:    Component Value Date/Time   COLORURINE YELLOW 09/12/2020 2317   APPEARANCEUR CLOUDY (A) 09/29/2020 2317   LABSPEC 1.020 09/28/2020 2317   PHURINE 5.0 09/14/2020 2317   GLUCOSEU NEGATIVE 09/08/2020 2317   HGBUR LARGE (A) 09/21/2020 2317   BILIRUBINUR NEGATIVE 09/20/2020 2317   KETONESUR NEGATIVE 09/29/2020 2317   PROTEINUR 100 (A) 09/09/2020 2317   NITRITE NEGATIVE 09/13/2020 2317   LEUKOCYTESUR MODERATE (A) 09/09/2020 2317   Sepsis Labs: @LABRCNTIP (procalcitonin:4,lacticidven:4)  ) Recent Results (from the past 240 hour(s))  Culture, blood (Routine x 2)     Status: Abnormal (Preliminary result)   Collection Time: 09/08/2020  2:38 PM   Specimen: BLOOD  Result Value Ref Range Status   Specimen Description   Final    BLOOD BLOOD LEFT HAND Performed at [facility], [address], [place], [place]    Special Requests   Final    BOTTLES DRAWN AEROBIC AND ANAEROBIC Blood Culture adequate volume Performed at [facility], [address] [place], [place]    Culture  Setup Time   Final    GRAM POSITIVE COCCI IN CLUSTERS AEROBIC BOTTLE ONLY Organism ID to follow CRITICAL RESULT CALLED TO, READ BACK BY AND VERIFIED WITH: WALID NAZARI 1524 09/23/20 AMK    Culture (A)  Final    STAPHYLOCOCCUS EPIDERMIDIS THE SIGNIFICANCE OF ISOLATING THIS ORGANISM FROM A SINGLE SET OF BLOOD CULTURES WHEN MULTIPLE SETS ARE DRAWN IS UNCERTAIN. PLEASE NOTIFY THE MICROBIOLOGY DEPARTMENT WITHIN ONE WEEK IF SPECIATION AND SENSITIVITIES ARE REQUIRED. Performed at [facility], [address], [place], [place]    Report Status PENDING   Incomplete  Blood Culture ID Panel (Reflexed)     Status: Abnormal   Collection Time: 10/01/2020  2:38 PM  Result Value Ref Range Status   Enterococcus faecalis NOT DETECTED NOT DETECTED Final   Enterococcus Faecium NOT DETECTED NOT DETECTED Final   Listeria monocytogenes NOT DETECTED NOT DETECTED Final   Staphylococcus species DETECTED (A) NOT DETECTED Final    Comment: CRITICAL RESULT CALLED TO, READ BACK BY AND VERIFIED WITH: WALID NAZARI 1524 09/23/20 AMK    Staphylococcus aureus (BCID)  NOT DETECTED NOT DETECTED Final   Staphylococcus epidermidis DETECTED (A) NOT DETECTED Final    Comment: Methicillin (oxacillin) resistant coagulase negative staphylococcus. Possible blood culture contaminant (unless isolated from more than one blood culture draw or clinical case suggests pathogenicity). No antibiotic treatment is indicated for blood  culture contaminants. CRITICAL RESULT CALLED TO, READ BACK BY AND VERIFIED WITH: WALID NAZARI 1524 09/23/20 AMK    Staphylococcus lugdunensis NOT DETECTED NOT DETECTED Final   Streptococcus species NOT DETECTED NOT DETECTED Final   Streptococcus agalactiae NOT DETECTED NOT DETECTED Final   Streptococcus pneumoniae NOT DETECTED NOT DETECTED Final   Streptococcus pyogenes NOT DETECTED NOT DETECTED Final   A.calcoaceticus-baumannii NOT DETECTED NOT DETECTED Final   Bacteroides fragilis NOT DETECTED NOT DETECTED Final   Enterobacterales NOT DETECTED NOT DETECTED Final   Enterobacter cloacae complex NOT DETECTED NOT DETECTED Final   Escherichia coli NOT DETECTED NOT DETECTED Final   Klebsiella aerogenes NOT DETECTED NOT DETECTED Final   Klebsiella oxytoca NOT DETECTED NOT DETECTED Final   Klebsiella pneumoniae NOT DETECTED NOT DETECTED Final   Proteus species NOT DETECTED NOT DETECTED Final   Salmonella species NOT DETECTED NOT DETECTED Final   Serratia marcescens NOT DETECTED NOT DETECTED Final   Haemophilus influenzae NOT DETECTED NOT DETECTED Final    Neisseria meningitidis NOT DETECTED NOT DETECTED Final   Pseudomonas aeruginosa NOT DETECTED NOT DETECTED Final   Stenotrophomonas maltophilia NOT DETECTED NOT DETECTED Final   Candida albicans NOT DETECTED NOT DETECTED Final   Candida auris NOT DETECTED NOT DETECTED Final   Candida glabrata NOT DETECTED NOT DETECTED Final   Candida krusei NOT DETECTED NOT DETECTED Final   Candida parapsilosis NOT DETECTED NOT DETECTED Final   Candida tropicalis NOT DETECTED NOT DETECTED Final   Cryptococcus neoformans/gattii NOT DETECTED NOT DETECTED Final   Methicillin resistance mecA/C DETECTED (A) NOT DETECTED Final    Comment: CRITICAL RESULT CALLED TO, READ BACK BY AND VERIFIED WITH: Mila Merry 1524 09/23/20 AMK Performed at [facility], [address] [place], [place] [address]   Culture, blood (Routine x 2)     Status: None (Preliminary result)   Collection Time: 09/26/2020  3:24 PM   Specimen: BLOOD  Result Value Ref Range Status   Specimen Description BLOOD RIGHT ANTECUBITAL  Final   Special Requests   Final    BOTTLES DRAWN AEROBIC AND ANAEROBIC Blood Culture adequate volume   Culture   Final    NO GROWTH 3 DAYS Performed at [facility], [address] [place], [place] [address]    Report Status PENDING  Incomplete  Resp Panel by RT-PCR (Flu A&B, Covid) Nasopharyngeal Swab     Status: None   Collection Time: 09/28/2020  3:24 PM   Specimen: Nasopharyngeal Swab; Nasopharyngeal(NP) swabs in vial transport medium  Result Value Ref Range Status   SARS Coronavirus 2 by RT PCR NEGATIVE NEGATIVE Final    Comment: (NOTE) SARS-CoV-2 target nucleic acids are NOT DETECTED.  The SARS-CoV-2 RNA is generally detectable in upper respiratory specimens during the acute phase of infection. The lowest concentration of SARS-CoV-2 viral copies this assay can detect is 138 copies/mL. A negative result does not preclude SARS-Cov-2 infection and should not be used as the sole basis for  treatment or other patient management decisions. A negative result may occur with  improper specimen collection/handling, submission of specimen other than nasopharyngeal swab, presence of viral mutation(s) within the areas targeted by this assay, and inadequate number of viral copies(<138  copies/mL). A negative result must be combined with clinical observations, patient history, and epidemiological information. The expected result is Negative.  Fact Sheet for Patients:  BloggerCourse.com  Fact Sheet for Healthcare Providers:  SeriousBroker.it  This test is no t yet approved or cleared by the [place] FDA and  has been authorized for detection and/or diagnosis of SARS-CoV-2 by FDA under an Emergency Use Authorization (EUA). This EUA will remain  in effect (meaning this test can be used) for the duration of the COVID-19 declaration under Section 564(b)(1) of the Act, 21 U.S.C.section 360bbb-3(b)(1), unless the authorization is terminated  or revoked sooner.       Influenza A by PCR NEGATIVE NEGATIVE Final   Influenza B by PCR NEGATIVE NEGATIVE Final    Comment: (NOTE) The Xpert Xpress SARS-CoV-2/FLU/RSV plus assay is intended as an aid in the diagnosis of influenza from Nasopharyngeal swab specimens and should not be used as a sole basis for treatment. Nasal washings and aspirates are unacceptable for Xpert Xpress SARS-CoV-2/FLU/RSV testing.  Fact Sheet for Patients: BloggerCourse.com  Fact Sheet for Healthcare Providers: SeriousBroker.it  This test is not yet approved or cleared by the [place] FDA and has been authorized for detection and/or diagnosis of SARS-CoV-2 by FDA under an Emergency Use Authorization (EUA). This EUA will remain in effect (meaning this test can be used) for the duration of the COVID-19 declaration under Section 564(b)(1) of the Act, 21  U.S.C. section 360bbb-3(b)(1), unless the authorization is terminated or revoked.  Performed at [facility], [address] [place], [place] [address]   MRSA Next Gen by PCR, Nasal     Status: None   Collection Time: 14-Oct-2020 11:06 PM   Specimen: Nasal Mucosa; Nasal Swab  Result Value Ref Range Status   MRSA by PCR Next Gen NOT DETECTED NOT DETECTED Final    Comment: (NOTE) The GeneXpert MRSA Assay (FDA approved for NASAL specimens only), is one component of a comprehensive MRSA colonization surveillance program. It is not intended to diagnose MRSA infection nor to guide or monitor treatment for MRSA infections. Test performance is not FDA approved in patients less than 72 years old. Performed at [facility], [address] [place], [place] [address]   Urine Culture     Status: Abnormal   Collection Time: 10-14-2020 11:17 PM   Specimen: In/Out Cath Urine  Result Value Ref Range Status   Specimen Description   Final    IN/OUT CATH URINE Performed at [facility], [address], [place], [place] [address]    Special Requests   Final    NONE Performed at [facility], [address] [place], [place] [address]    Culture MULTIPLE SPECIES PRESENT, SUGGEST RECOLLECTION (A)  Final   Report Status 09/24/2020 FINAL  Final  C Difficile Quick Screen w PCR reflex     Status: None   Collection Time: 09/24/20  1:49 PM   Specimen: STOOL  Result Value Ref Range Status   C Diff antigen NEGATIVE NEGATIVE Final   C Diff toxin NEGATIVE NEGATIVE Final   C Diff interpretation No C. difficile detected.  Final    Comment: VALID Performed at [facility], [address] [place], [place] [address]          Radiology Studies: No results found.      Scheduled Meds:  Chlorhexidine Gluconate Cloth  6 each Topical Daily   heparin  5,000 Units Subcutaneous Q8H   insulin aspart  0-15 Units Subcutaneous Q4H   Continuous Infusions:   0.45 % NaCl with KCl 20 mEq / L     ceFEPime (MAXIPIME) IV     metronidazole 500 mg (09/25/20 0934)   vancomycin 1,000 mg (09/24/20 1150)     LOS: 3 days    Time spent: 30 min    Silvano Bilis, MD [facility]   If 7PM-7AM, please contact night-coverage [email or web address] Password [facility] 09/25/2020, 10:48 AM

## 2020-09-25 NOTE — TOC Progression Note (Signed)
Transition of Care [facility]) - Progression Note    Patient Details  Name: Virginia Mann MRN: [id number] Date of Birth: August 16, 1940  [facility] CM/SW Contact  Barrie Dunker, RN Phone Number: 09/25/2020, 9:34 AM  Clinical Narrative:    Patient transferred to Unit from ICU, she is a DNR but not comfort care, no amputation planned. Palliative to consult and talk with the family,         Expected Discharge Plan and Services                                                 Social Determinants of Health (SDOH) Interventions    Readmission Risk Interventions No flowsheet data found.

## 2020-09-25 NOTE — Progress Notes (Signed)
   09/25/20 1600  Assess: MEWS Score  Temp 99.3 F (37.4 C)  BP 105/68  Pulse Rate (!) 109  Resp (!) 42  Level of Consciousness Responds to Pain  SpO2 95 %  O2 Device Room Air  Assess: MEWS Score  MEWS Temp 0  MEWS Systolic 0  MEWS Pulse 1  MEWS RR 3  MEWS LOC 2  MEWS Score 6  MEWS Score Color Red  Assess: if the MEWS score is Yellow or Red  Were vital signs taken at a resting state? Yes  Focused Assessment No change from prior assessment  Does the patient meet 2 or more of the SIRS criteria? No  MEWS guidelines implemented *See Row Information* No, previously red, continue vital signs every 4 hours  Treat  Pain Scale 0-10  Pain Score Asleep  Escalate  MEWS: Escalate Red: discuss with charge nurse/RN and provider, consider discussing with RRT  Notify: Charge Nurse/RN  Name of Charge Nurse/RN Notified Marlea Gambill RN/Bet LPN  Date Charge Nurse/RN Notified 09/25/20  Time Charge Nurse/RN Notified 1600  Document  Patient Outcome Other (Comment) (DNR, no changes)  Progress note created (see row info) Yes  Assess: SIRS CRITERIA  SIRS Temperature  0  SIRS Pulse 1  SIRS Respirations  1  SIRS WBC 1  SIRS Score Sum  3

## 2020-09-25 NOTE — Progress Notes (Signed)
Pharmacy Antibiotic Note  Virginia Mann is a 80 y.o. female admitted on 09/23/2020 with  osteomyelitis .  Pharmacy has been consulted for Vancomycin, Cefepime dosing. Pt appears to have AKI that is now improving (baseline SCr < 1.0).  Plan: Creatinine decreasing  Continue 1000 mg IV q36h based on current renal function  Goal AUC 400-550 Expected AUC: 489 SCr used: 0.92  Adjust Cefepime to 2 g IV q12h   Height: 5\' 1"  (154.9 cm) Weight: 52.6 kg (115 lb 15.4 oz) IBW/kg (Calculated) : 47.8  Temp (24hrs), Avg:98.1 F (36.7 C), Min:97.6 F (36.4 C), Max:98.5 F (36.9 C)  Recent Labs  Lab 09/13/2020 1438 10/05/2020 1439 09/10/2020 2205 09/23/20 0429 09/23/20 1227 09/24/20 0526 09/24/20 0901 09/25/20 0421  WBC 24.9*  --   --  23.4*  --  23.1*  --  21.9*  CREATININE 1.90*  --  1.65* 1.24*  --  1.02*  --  0.92  LATICACIDVEN  --  2.8* 2.9*  --  2.0*  --  1.1  --      Estimated Creatinine Clearance: 38 mL/min (by C-G formula based on SCr of 0.92 mg/dL).    Allergies  Allergen Reactions   Apixaban Itching and Rash    Antimicrobials this admission: Vancomycin 9/16 >> Cefepime 9/16 x 1, 9/18 >> Unasyn 9/17 >> 9/18   Microbiology results: 9/16 BCx: 1/4 methicillin resistant staph epi 9/16 UCx: pending 9/16 MRSA PCR: negative  Thank you for allowing pharmacy to be a part of this patient's care.  10/16, PharmD, BCPS Clinical Pharmacist 09/25/2020 9:23 AM

## 2020-09-25 NOTE — Progress Notes (Signed)
   09/25/20 1243  Assess: MEWS Score  Temp 99.3 F (37.4 C)  BP 102/66  Pulse Rate (!) 106  Resp (!) 32  SpO2 95 %  Assess: MEWS Score  MEWS Temp 0  MEWS Systolic 0  MEWS Pulse 1  MEWS RR 2  MEWS LOC 2  MEWS Score 5  MEWS Score Color Red  Assess: if the MEWS score is Yellow or Red  Were vital signs taken at a resting state? Yes  Focused Assessment No change from prior assessment  Does the patient meet 2 or more of the SIRS criteria? No  MEWS guidelines implemented *See Row Information* No, previously red, continue vital signs every 4 hours  Escalate  MEWS: Escalate Red: discuss with charge nurse/RN and provider, consider discussing with RRT  Notify: Charge Nurse/RN  Name of Charge Nurse/RN Notified Jessiah Wojnar RN/Bet LPN (Simultaneous filing. User may not have seen previous data.)  Date Charge Nurse/RN Notified 09/25/20 (Simultaneous filing. User may not have seen previous data.)  Time Charge Nurse/RN Notified 1243 (Simultaneous filing. User may not have seen previous data.)  Notify: Provider  Provider Name/Title [facility]  Date Provider Notified 09/25/20  Time Provider Notified 1243  Notification Type Page  Notification Reason Other (Comment) (mews red, no change in status)  Provider response Other (Comment) (NP discussing with family on possible comfort care options)  Date of Provider Response 09/25/20  Time of Provider Response 1243  Document  Patient Outcome Other (Comment) (patient actively dying per MD, family discussing comfort care options)  Progress note created (see row info) Yes  Assess: SIRS CRITERIA  SIRS Temperature  0  SIRS Pulse 1  SIRS Respirations  1  SIRS WBC 1  SIRS Score Sum  3

## 2020-09-25 NOTE — Consult Note (Addendum)
Consultation Note Date: 09/25/2020   Patient Name: Virginia Mann  DOB: 1941-01-03  MRN: [id number]  Age / Sex: 80 y.o., female  PCP: Gustavo Lah, MD Referring Physician: Gwynne Edinger, MD  Reason for Consultation: Establishing goals of care  HPI/Patient Profile: 80 y.o. female  with past medical history of strokes, HFpEF, atrial fibrillation, HTN, DVT, diabetes, CKD, GIB, severe PAD with chronic osteomyelitis and dry gangrene right second toe (previously refused amputation) admitted on 10/05/2020 with altered mental status and fever with known sacral and bilateral foot/heel wounds and found to have septic shock and possible UTI.   Clinical Assessment and Goals of Care: I met today at Virginia Mann' bedside and then with her husband, son, and daughter. We discussed her progressive illness over the months and years with recurrent strokes and chronic osteomyelitis. They all understand that her condition is worsening and her time left to live is becoming short. We discussed worsening assessment of bilateral toes/feet since admission. We discussed poor blood flow to feet making antibiotics ineffective and also infection down into bone making infection difficult for antibiotics to reach. She is slightly more alert although no speaking (she did make a grunting noise once to me) but also grimacing and muscles tense and appears in pain and suffering. I discussed with family my concern for suffering and the interventions we are providing may be adding to her suffering. They understand.   Family shares that they had discussed with previous provider a 3 day trial of antibiotics and if no improvement reconsider care options. After full discussion of limited improvement although family do find her more responsive (eyes open) but her WBC is not significantly improved and assessment of her feet looks worse. They do feel  that Virginia Mann would not want to live this way. Today is the birthday of a granddaughter so they do not want to transition to comfort until tomorrow. If no significant improvement by tomorrow we will consider comfort care and transition to hospice facility. Husband wants to make sure we have tried everything we can. We also discussed providing pain medication and that her comfort is priority. Discussed allowance of pain medication if needed regardless of blood pressure readings. No plans to escalate care, no central line or vasopressors. Continue current care with likely transition to comfort tomorrow.   All questions/concerns addressed. Emotional support provided.  Primary Decision Maker NEXT OF KIN husband and adult children    SUMMARY OF RECOMMENDATIONS   - DNR - No central line, no vasopressors - Continue current care without escalation and if no significant improvement by tomorrow family considering comfort care/hospice  Code Status/Advance Care Planning: DNR   Symptom Management:  Pain/shortness of breatj: Dilaudid 0.5 mg IV every 3 hours prn. DO NOT hold for any reason if in pain. Primary goal is minimizing suffering.   Palliative Prophylaxis:  Frequent Pain Assessment, Oral Care, Palliative Wound Care, and Turn Reposition  Psycho-social/Spiritual:  Desire for further Chaplaincy support:yes Additional Recommendations: Education on Hospice and Grief/Bereavement Support  Prognosis:  Likely days.   Discharge Planning: To Be Determined      Primary Diagnoses: Present on Admission:  Chronic a-fib ([place])   I have reviewed the medical record, interviewed the patient and family, and examined the patient. The following aspects are pertinent.  Past Medical History:  Diagnosis Date   Chronic a-fib ([facility])    Chronic diastolic (congestive) heart failure ([facility])    Diabetes mellitus without complication ([facility])    DVT (deep venous thrombosis) (H[facility])    HTN (hypertension)    Renal  disorder    Stroke [facility])    Social History   Socioeconomic History   Marital status: Married    Spouse name: Not on file   Number of children: Not on file   Years of education: Not on file   Highest education level: Not on file  Occupational History   Not on file  Tobacco Use   Smoking status: Former    Types: Cigarettes   Smokeless tobacco: Never  Vaping Use   Vaping Use: Never used  Substance and Sexual Activity   Alcohol use: Not Currently   Drug use: Not Currently   Sexual activity: Not Currently  Other Topics Concern   Not on file  Social History Narrative   Not on file   Social Determinants of Health   Financial Resource Strain: Not on file  Food Insecurity: Not on file  Transportation Needs: Not on file  Physical Activity: Not on file  Stress: Not on file  Social Connections: Not on file   Family History  Problem Relation Age of Onset   CAD Father    Hypertension Father    Scheduled Meds:  Chlorhexidine Gluconate Cloth  6 each Topical Daily   heparin  5,000 Units Subcutaneous Q8H   insulin aspart  0-15 Units Subcutaneous Q4H   Continuous Infusions:  ceFEPime (MAXIPIME) IV     metronidazole 500 mg (09/25/20 0934)   vancomycin 1,000 mg (09/24/20 1150)   PRN Meds:.acetaminophen, docusate sodium, polyethylene glycol Allergies  Allergen Reactions   Apixaban Itching and Rash   Review of Systems  Unable to perform ROS: Acuity of condition   Physical Exam Vitals and nursing note reviewed.  Constitutional:      Appearance: She is cachectic. She is ill-appearing.     Comments: Frail, muscles tense, guarded  Cardiovascular:     Rate and Rhythm: Tachycardia present.  Pulmonary:     Effort: No tachypnea, accessory muscle usage or respiratory distress.  Abdominal:     Palpations: Abdomen is soft.  Skin:    Comments: R foot with dressing clean, intact but toes necrotic, red/black, swollen L toes cool to touch and slight mottling/cyanosis   Neurological:     Comments: Eyes open but does not follow commands, unable to verbalize    Vital Signs: BP 114/74 (BP Location: Right Arm)   Pulse (!) 105   Temp 97.6 F (36.4 C) (Oral)   Resp 20   Ht 5' 1"  (1.549 m)   Wt 52.6 kg   SpO2 100%   BMI 21.91 kg/m  Pain Scale: PAINAD       SpO2: SpO2: 100 % O2 Device:SpO2: 100 % O2 Flow Rate: .   IO: Intake/output summary:  Intake/Output Summary (Last 24 hours) at 09/25/2020 0948 Last data filed at 09/25/2020 0420 Gross per 24 hour  Intake --  Output 1300 ml  Net -1300 ml    LBM: Last BM Date: 09/24/20 Baseline Weight: Weight: 52.4 kg  Most recent weight: Weight: 52.6 kg     Palliative Assessment/Data:     Time In: 1200 Time Out: 1310 Time Total: 70 min Greater than 50%  of this time was spent counseling and coordinating care related to the above assessment and plan.  Signed by: Vinie Sill, NP Palliative Medicine Team Pager # [phone] (M-F 8a-5p) Team Phone # [phone] (Nights/Weekends)

## 2020-09-25 NOTE — Consult Note (Addendum)
WOC Nurse wound consult note Consultation was completed by review of records, images and assistance from the bedside nurse/clinical staff.   Reason for Consult:bilateral foot wounds and sacrum Patient known to have severe PAD, offered amputations at [facility] declined; history of stroke recently  Wound type: Unstageable Pressure Injury: right heel: 100% black eschar; stable Unstageable Pressure Injury: left heel: 100% black eschar; stable Stage 2 Pressure Injury:Sacrum: 100% pink, partial thickness Arterial ulceration; left lateral foot: 100% necrotic Arterial ulceration; right lateral foot: 100% necrotic Arterial ulceration; left great toe; 100% necrotic Pressure Injury POA: Yes/No/NA Measurement: see nursing flow sheets Wound bed:see above  Drainage (amount, consistency, odor) none Periwound:intact  Dressing procedure/placement/frequency Paint all wounds with betadine to keep dry and stable; with history of PAD would not want to open any of these wounds Appears to be transitioning to comfort care Prevalon boots to offload heels Silicone foam to the sacrum to protect; insulate   Re consult if needed, will not follow at this time. Thanks  Gethsemane Fischler [employer], RN,CWOCN, CNS, CWON-AP [phone])

## 2020-09-25 NOTE — Progress Notes (Signed)
Vitals entered manually- [facility]ap didn't send through to epic during 7am and 11am vitals

## 2020-09-26 DIAGNOSIS — I70229 Atherosclerosis of native arteries of extremities with rest pain, unspecified extremity: Secondary | ICD-10-CM | POA: Diagnosis not present

## 2020-09-26 DIAGNOSIS — Z515 Encounter for palliative care: Secondary | ICD-10-CM | POA: Diagnosis not present

## 2020-09-26 DIAGNOSIS — Z7189 Other specified counseling: Secondary | ICD-10-CM | POA: Diagnosis not present

## 2020-09-26 DIAGNOSIS — M86371 Chronic multifocal osteomyelitis, right ankle and foot: Secondary | ICD-10-CM | POA: Diagnosis not present

## 2020-09-26 LAB — CBC
HCT: 31.5 % — ABNORMAL LOW (ref 36.0–46.0)
Hemoglobin: 9.2 g/dL — ABNORMAL LOW (ref 12.0–15.0)
MCH: 25.9 pg — ABNORMAL LOW (ref 26.0–34.0)
MCHC: 29.2 g/dL — ABNORMAL LOW (ref 30.0–36.0)
MCV: 88.7 fL (ref 80.0–100.0)
Platelets: 288 10*3/uL (ref 150–400)
RBC: 3.55 MIL/uL — ABNORMAL LOW (ref 3.87–5.11)
RDW: 20.7 % — ABNORMAL HIGH (ref 11.5–15.5)
WBC: 21.8 10*3/uL — ABNORMAL HIGH (ref 4.0–10.5)
nRBC: 0.2 % (ref 0.0–0.2)

## 2020-09-26 LAB — GLUCOSE, CAPILLARY
Glucose-Capillary: 126 mg/dL — ABNORMAL HIGH (ref 70–99)
Glucose-Capillary: 163 mg/dL — ABNORMAL HIGH (ref 70–99)
Glucose-Capillary: 188 mg/dL — ABNORMAL HIGH (ref 70–99)

## 2020-09-26 LAB — CULTURE, BLOOD (ROUTINE X 2): Special Requests: ADEQUATE

## 2020-09-26 LAB — PHOSPHORUS: Phosphorus: 3.7 mg/dL (ref 2.5–4.6)

## 2020-09-26 LAB — MAGNESIUM: Magnesium: 2.6 mg/dL — ABNORMAL HIGH (ref 1.7–2.4)

## 2020-09-26 LAB — BASIC METABOLIC PANEL
Anion gap: 9 (ref 5–15)
BUN: 84 mg/dL — ABNORMAL HIGH (ref 8–23)
CO2: 17 mmol/L — ABNORMAL LOW (ref 22–32)
Calcium: 8.7 mg/dL — ABNORMAL LOW (ref 8.9–10.3)
Chloride: 128 mmol/L — ABNORMAL HIGH (ref 98–111)
Creatinine, Ser: 1.04 mg/dL — ABNORMAL HIGH (ref 0.44–1.00)
GFR, Estimated: 55 mL/min — ABNORMAL LOW (ref >60)
Glucose, Bld: 188 mg/dL — ABNORMAL HIGH (ref 70–99)
Potassium: 3.9 mmol/L (ref 3.5–5.1)
Sodium: 154 mmol/L — ABNORMAL HIGH (ref 135–145)

## 2020-09-26 MED ORDER — ONDANSETRON 4 MG PO TBDP
4.0000 mg | ORAL_TABLET | Freq: Four times a day (QID) | ORAL | Status: DC | PRN
  Filled 2020-09-26: qty 1

## 2020-09-26 MED ORDER — HYDROMORPHONE HCL 1 MG/ML IJ SOLN: 1.0000 mg | INTRAMUSCULAR | Status: DC | PRN

## 2020-09-26 MED ORDER — HALOPERIDOL LACTATE 2 MG/ML PO CONC
0.5000 mg | ORAL | Status: DC | PRN
  Filled 2020-09-26: qty 0.3

## 2020-09-26 MED ORDER — BIOTENE DRY MOUTH MT LIQD: 15.0000 mL | OROMUCOSAL | Status: DC | PRN

## 2020-09-26 MED ORDER — LORAZEPAM 2 MG/ML IJ SOLN
0.5000 mg | Freq: Four times a day (QID) | INTRAMUSCULAR | Status: DC | PRN
  Administered 2020-09-26: 0.5 mg via INTRAVENOUS
  Filled 2020-09-26: qty 1

## 2020-09-26 MED ORDER — GLYCOPYRROLATE 1 MG PO TABS
1.0000 mg | ORAL_TABLET | ORAL | Status: DC | PRN
  Filled 2020-09-26: qty 1

## 2020-09-26 MED ORDER — GLYCOPYRROLATE 0.2 MG/ML IJ SOLN
0.2000 mg | INTRAMUSCULAR | Status: DC | PRN
  Filled 2020-09-26: qty 1

## 2020-09-26 MED ORDER — POLYVINYL ALCOHOL 1.4 % OP SOLN
1.0000 [drp] | Freq: Four times a day (QID) | OPHTHALMIC | Status: DC | PRN
  Filled 2020-09-26: qty 15

## 2020-09-26 MED ORDER — HALOPERIDOL LACTATE 5 MG/ML IJ SOLN: 0.5000 mg | INTRAMUSCULAR | Status: DC | PRN

## 2020-09-26 MED ORDER — HYDROMORPHONE HCL 1 MG/ML IJ SOLN
1.0000 mg | INTRAMUSCULAR | Status: DC
  Administered 2020-09-26 – 2020-09-27 (×3): 1 mg via INTRAVENOUS
  Filled 2020-09-26 (×3): qty 1

## 2020-09-26 MED ORDER — HALOPERIDOL 0.5 MG PO TABS
0.5000 mg | ORAL_TABLET | ORAL | Status: DC | PRN
  Filled 2020-09-26: qty 1

## 2020-09-26 MED ORDER — ONDANSETRON HCL 4 MG/2ML IJ SOLN: 4.0000 mg | Freq: Four times a day (QID) | INTRAMUSCULAR | Status: DC | PRN

## 2020-09-26 MED ORDER — HYDROMORPHONE HCL 1 MG/ML IJ SOLN
0.5000 mg | Freq: Four times a day (QID) | INTRAMUSCULAR | Status: DC
Start: 2020-09-26 — End: 2020-09-26
  Administered 2020-09-26: 0.5 mg via INTRAVENOUS
  Filled 2020-09-26: qty 1

## 2020-09-26 MED ORDER — GLYCOPYRROLATE 0.2 MG/ML IJ SOLN
0.2000 mg | Freq: Three times a day (TID) | INTRAMUSCULAR | Status: DC
  Administered 2020-09-26: 0.2 mg via INTRAVENOUS
  Filled 2020-09-26 (×5): qty 1

## 2020-09-26 MED ORDER — METRONIDAZOLE 500 MG/100ML IV SOLN
500.0000 mg | Freq: Three times a day (TID) | INTRAVENOUS | Status: DC
  Filled 2020-09-26 (×3): qty 100

## 2020-09-26 NOTE — Progress Notes (Signed)
PROGRESS NOTE    Virginia Mann  ZOX:[id number] DOB: [birth year] DOA: 10/06/2020 PCP: Lynwood Dawley, MD  Outpatient Specialists: cardiology, neurology, vascular surgery    Brief Narrative:  From admission hpi and ppcm transfer note: 80 year old female presenting to [facility] ED on 10-06-2020 from [facility] due to concerns for sepsis.  Per the patient's son and daughter who are at bedside she was recently hospitalized for 22 days at [facility] due to a stroke.  She was discharged to [facility] approximately 3 days ago and has had worsening altered mental status at the SNF.  Today the son became very concerned of developing sepsis after her lab work showed worsening leukocytosis in addition to the altered mental status and fever.  Of note the patient has been declining with 2 recent strokes and multiple chronic wounds including a sacral pressure injury as well as bilateral foot/heel wounds with surrounding erythema.    Patient worked up per septic protocol receiving 2.5 L of LR and empiric antibiotic coverage: Cefepime/Flagyl and vancomycin for suspected source dry gangrene on right foot.  Post IV fluid resuscitation patient hypotensive requiring initiation of Levophed drip.  PCCM consulted for admission.   All history gathered from son and daughter who are bedside as the patient is minimally responsive.  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  The patient and family have declined antibiotics or surgical intervention during hospitalization in August 2022.  Per the family the patient's vascular doctor at [facility] offered amputation but was concerned about the degree of vascular disease, thinking the patient would ultimately end up with an AKA.  Per family the patient responded to this decision stating, " I would rather die than have my leg cut off."  Her children confirmed the patient changed her own CODE STATUS prior to this  admission to DNR/DNI.  We had a long discussion regarding goals of care and while they would like to treat the treatable with antibiotics and peripheral vasopressors, they are not interested in surgical intervention for source control.  Further discussion revealed that if the patient's vasopressor requirements were too high for peripheral IV administration, family would like to rediscuss goals of care at that time and consider comfort care at that time.  Family is also in agreement not to place p.o. access at this time.   Assessment & Plan:   Active Problems:   Diabetes mellitus without complication (HCC)   Chronic a-fib (HCC)   Septic shock (HCC)   Pressure injury of skin   Acute kidney injury superimposed on CKD (HCC)   Vascular dementia (HCC)   Osteomyelitis of foot (HCC)   Metabolic acidosis, normal anion gap (NAG)   Acute encephalopathy  # End of life care DNR/DNI, no feeding tube, no central line. - family meeting this afternoon, transitioned to comfort care  # Diarrhea C diff neg  # Acute on chronic metabolic encephalopathy Multifactorial 2/2 remote and recent CVA, sepsis  # Multiple lower extremity and sacral pressure ulcers - wound care consulted  # Sepsis # Right foot ostemyelitis # Staph epidermidis bacteremia Here tachycardic, elevated lactate, elevated WBCs. Staph on blood cultures. Initially treated in the ICU with pressors. And with broad-spectrum antibiotics - comfort care now  # Hypernatremia 2/2 lack of access to free fluids  # Dementia # History CVA Admitted last month [facility] with CVA x2. Not discharged w/ anticoagulation or antiplatelet due to history of GI  bleeds. Currently unresponsive.  # PAD # History mesenteric ischemia # Renal artery stenosis # Dry gangrene Patient previously declined amputation  # History multiple GI bleeds No current signs bleeding  # Atrial fibrillation Off anticoagulation 2/2 recurrent GI bleeds  # T2DM Controlled off  meds  Code Status: DNR/DNI Family Communication: family meeting with husband, son, daughter, and palliative care 9/20  Level of care: Med-Surg Status is: Inpatient  Remains inpatient appropriate because:Inpatient level of care appropriate due to severity of illness  Dispo: The patient is from: SNF              Anticipated d/c is to: in-hospital death               Patient currently is not medically stable to d/c.   Difficult to place patient No        Consultants:  palliative  Procedures: none  Subjective: Asleep, not responsive  Objective: Vitals:   09/26/20 0500 09/26/20 0537 09/26/20 0846 09/26/20 1600  BP:  (!) 100/58 (!) 91/59 (!) 85/55  Pulse:  (!) 104 (!) 102 100  Resp:  (!) 38 (!) 38 (!) 32  Temp:  97.6 F (36.4 C) 97.6 F (36.4 C) (!) 97.5 F (36.4 C)  TempSrc:   Oral Oral  SpO2:  94% 98% 95%  Weight: 54.8 kg     Height:        Intake/Output Summary (Last 24 hours) at 09/26/2020 1603 Last data filed at 09/26/2020 9767 Gross per 24 hour  Intake --  Output 800 ml  Net -800 ml   Filed Weights   09/24/20 0500 09/25/20 0500 09/26/20 0500  Weight: 53.8 kg 52.6 kg 54.8 kg    Examination:  General exam: Appears chronically ill appearing, not responsive Respiratory system: rales at bases Cardiovascular system: S1 & S2 heard, tachycardic, irreg Gastrointestinal system: soft Central nervous system: unconscious, rouses somewhat when examined Extremities: decreased muscle tone. Cool lower extremities Skin: sacral decubitus ulcers appear to be stage 2. Bandaged right foot, photos show missing several digits with bone visible, lateral foot ulcers bilaterally Psychiatry: unable to assess    Data Reviewed: I have personally reviewed following labs and imaging studies  CBC: Recent Labs  Lab 10-21-20 1438 09/23/20 0429 09/24/20 0526 09/25/20 0421 09/26/20 0454  WBC 24.9* 23.4* 23.1* 21.9* 21.8*  NEUTROABS 22.5*  --  21.1*  --   --   HGB 11.4*  8.5* 8.9* 8.9* 9.2*  HCT 36.5 27.5* 28.6* 29.0* 31.5*  MCV 87.1 85.4 87.2 87.3 88.7  PLT 425* 290 279 282 288   Basic Metabolic Panel: Recent Labs  Lab 10-21-20 2205 09/23/20 0429 09/24/20 0526 09/25/20 0421 09/26/20 0454  NA 141 141 148* 152* 154*  K 4.4 3.7 3.6 3.4* 3.9  CL 113* 114* 119* 125* 128*  CO2 15* 19* 21* 20* 17*  GLUCOSE 208* 196* 117* 145* 188*  BUN 90* 84* 78* 75* 84*  CREATININE 1.65* 1.24* 1.02* 0.92 1.04*  CALCIUM 10.2 9.5 9.4 9.2 8.7*  MG 2.5* 2.1 2.4  --  2.6*  PHOS 4.9* 4.1 3.8  --  3.7   GFR: Estimated Creatinine Clearance: 33.6 mL/min (A) (by C-G formula based on SCr of 1.04 mg/dL (H)). Liver Function Tests: Recent Labs  Lab 10-21-20 1438  AST 38  ALT 25  ALKPHOS 107  BILITOT 0.6  PROT 7.0  ALBUMIN 3.0*   No results for input(s): LIPASE, AMYLASE in the last 168 hours. No results for input(s): AMMONIA in  the last 168 hours. Coagulation Profile: Recent Labs  Lab 09-26-20 1439  INR 1.2   Cardiac Enzymes: No results for input(s): CKTOTAL, CKMB, CKMBINDEX, TROPONINI in the last 168 hours. BNP (last 3 results) No results for input(s): PROBNP in the last 8760 hours. HbA1C: No results for input(s): HGBA1C in the last 72 hours.  CBG: Recent Labs  Lab 09/25/20 1645 09/25/20 2054 09/26/20 0012 09/26/20 0813 09/26/20 1439  GLUCAP 151* 135* 126* 163* 188*   Lipid Profile: No results for input(s): CHOL, HDL, LDLCALC, TRIG, CHOLHDL, LDLDIRECT in the last 72 hours. Thyroid Function Tests: No results for input(s): TSH, T4TOTAL, FREET4, T3FREE, THYROIDAB in the last 72 hours. Anemia Panel: No results for input(s): VITAMINB12, FOLATE, FERRITIN, TIBC, IRON, RETICCTPCT in the last 72 hours. Urine analysis:    Component Value Date/Time   COLORURINE YELLOW 09/26/20 2317   APPEARANCEUR CLOUDY (A) September 26, 2020 2317   LABSPEC 1.020 09-26-2020 2317   PHURINE 5.0 September 26, 2020 2317   GLUCOSEU NEGATIVE 2020/09/26 2317   HGBUR LARGE (A) 09-26-2020  2317   BILIRUBINUR NEGATIVE 09/26/2020 2317   KETONESUR NEGATIVE 2020-09-26 2317   PROTEINUR 100 (A) 09/26/20 2317   NITRITE NEGATIVE 2020/09/26 2317   LEUKOCYTESUR MODERATE (A) September 26, 2020 2317   Sepsis Labs: @LABRCNTIP (procalcitonin:4,lacticidven:4)  ) Recent Results (from the past 240 hour(s))  Culture, blood (Routine x 2)     Status: Abnormal   Collection Time: 09/26/20  2:38 PM   Specimen: BLOOD  Result Value Ref Range Status   Specimen Description   Final    BLOOD BLOOD LEFT HAND Performed at [facility], [address] [place], [place] [address]    Special Requests   Final    BOTTLES DRAWN AEROBIC AND ANAEROBIC Blood Culture adequate volume Performed at [facility], [address] [place], [place] [address]    Culture  Setup Time   Final    GRAM POSITIVE COCCI IN CLUSTERS AEROBIC BOTTLE ONLY Organism ID to follow CRITICAL RESULT CALLED TO, READ BACK BY AND VERIFIED WITH: WALID NAZARI 1524 09/23/20 AMK    Culture (A)  Final    STAPHYLOCOCCUS EPIDERMIDIS THE SIGNIFICANCE OF ISOLATING THIS ORGANISM FROM A SINGLE SET OF BLOOD CULTURES WHEN MULTIPLE SETS ARE DRAWN IS UNCERTAIN. PLEASE NOTIFY THE MICROBIOLOGY DEPARTMENT WITHIN ONE WEEK IF SPECIATION AND SENSITIVITIES ARE REQUIRED. Performed at [facility], [address] [place], [place] [address]    Report Status 09/26/2020 FINAL  Final  Blood Culture ID Panel (Reflexed)     Status: Abnormal   Collection Time: 26-Sep-2020  2:38 PM  Result Value Ref Range Status   Enterococcus faecalis NOT DETECTED NOT DETECTED Final   Enterococcus Faecium NOT DETECTED NOT DETECTED Final   Listeria monocytogenes NOT DETECTED NOT DETECTED Final   Staphylococcus species DETECTED (A) NOT DETECTED Final    Comment: CRITICAL RESULT CALLED TO, READ BACK BY AND VERIFIED WITH: WALID NAZARI 1524 09/23/20 AMK    Staphylococcus aureus (BCID) NOT DETECTED NOT DETECTED Final   Staphylococcus epidermidis DETECTED (A) NOT  DETECTED Final    Comment: Methicillin (oxacillin) resistant coagulase negative staphylococcus. Possible blood culture contaminant (unless isolated from more than one blood culture draw or clinical case suggests pathogenicity). No antibiotic treatment is indicated for blood  culture contaminants. CRITICAL RESULT CALLED TO, READ BACK BY AND VERIFIED WITH: WALID NAZARI 1524 09/23/20 AMK    Staphylococcus lugdunensis NOT DETECTED NOT DETECTED Final   Streptococcus species NOT DETECTED NOT DETECTED Final   Streptococcus agalactiae  NOT DETECTED NOT DETECTED Final   Streptococcus pneumoniae NOT DETECTED NOT DETECTED Final   Streptococcus pyogenes NOT DETECTED NOT DETECTED Final   A.calcoaceticus-baumannii NOT DETECTED NOT DETECTED Final   Bacteroides fragilis NOT DETECTED NOT DETECTED Final   Enterobacterales NOT DETECTED NOT DETECTED Final   Enterobacter cloacae complex NOT DETECTED NOT DETECTED Final   Escherichia coli NOT DETECTED NOT DETECTED Final   Klebsiella aerogenes NOT DETECTED NOT DETECTED Final   Klebsiella oxytoca NOT DETECTED NOT DETECTED Final   Klebsiella pneumoniae NOT DETECTED NOT DETECTED Final   Proteus species NOT DETECTED NOT DETECTED Final   Salmonella species NOT DETECTED NOT DETECTED Final   Serratia marcescens NOT DETECTED NOT DETECTED Final   Haemophilus influenzae NOT DETECTED NOT DETECTED Final   Neisseria meningitidis NOT DETECTED NOT DETECTED Final   Pseudomonas aeruginosa NOT DETECTED NOT DETECTED Final   Stenotrophomonas maltophilia NOT DETECTED NOT DETECTED Final   Candida albicans NOT DETECTED NOT DETECTED Final   Candida auris NOT DETECTED NOT DETECTED Final   Candida glabrata NOT DETECTED NOT DETECTED Final   Candida krusei NOT DETECTED NOT DETECTED Final   Candida parapsilosis NOT DETECTED NOT DETECTED Final   Candida tropicalis NOT DETECTED NOT DETECTED Final   Cryptococcus neoformans/gattii NOT DETECTED NOT DETECTED Final   Methicillin resistance  mecA/C DETECTED (A) NOT DETECTED Final    Comment: CRITICAL RESULT CALLED TO, READ BACK BY AND VERIFIED WITH: Mila Merry 1524 09/23/20 AMK Performed at [facility], [address] [place], [place] [address]   Culture, blood (Routine x 2)     Status: None (Preliminary result)   Collection Time: 10/02/2020  3:24 PM   Specimen: BLOOD  Result Value Ref Range Status   Specimen Description BLOOD RIGHT ANTECUBITAL  Final   Special Requests   Final    BOTTLES DRAWN AEROBIC AND ANAEROBIC Blood Culture adequate volume   Culture   Final    NO GROWTH 4 DAYS Performed at [facility], [address] [place], [place] [address]    Report Status PENDING  Incomplete  Resp Panel by RT-PCR (Flu A&B, Covid) Nasopharyngeal Swab     Status: None   Collection Time: 09/26/2020  3:24 PM   Specimen: Nasopharyngeal Swab; Nasopharyngeal(NP) swabs in vial transport medium  Result Value Ref Range Status   SARS Coronavirus 2 by RT PCR NEGATIVE NEGATIVE Final    Comment: (NOTE) SARS-CoV-2 target nucleic acids are NOT DETECTED.  The SARS-CoV-2 RNA is generally detectable in upper respiratory specimens during the acute phase of infection. The lowest concentration of SARS-CoV-2 viral copies this assay can detect is 138 copies/mL. A negative result does not preclude SARS-Cov-2 infection and should not be used as the sole basis for treatment or other patient management decisions. A negative result may occur with  improper specimen collection/handling, submission of specimen other than nasopharyngeal swab, presence of viral mutation(s) within the areas targeted by this assay, and inadequate number of viral copies(<138 copies/mL). A negative result must be combined with clinical observations, patient history, and epidemiological information. The expected result is Negative.  Fact Sheet for Patients:  BloggerCourse.com  Fact Sheet for Healthcare Providers:   SeriousBroker.it  This test is no t yet approved or cleared by the [place] FDA and  has been authorized for detection and/or diagnosis of SARS-CoV-2 by FDA under an Emergency Use Authorization (EUA). This EUA will remain  in effect (meaning this test can be used) for the duration of the COVID-19 declaration under  Section 564(b)(1) of the Act, 21 U.S.C.section 360bbb-3(b)(1), unless the authorization is terminated  or revoked sooner.       Influenza A by PCR NEGATIVE NEGATIVE Final   Influenza B by PCR NEGATIVE NEGATIVE Final    Comment: (NOTE) The Xpert Xpress SARS-CoV-2/FLU/RSV plus assay is intended as an aid in the diagnosis of influenza from Nasopharyngeal swab specimens and should not be used as a sole basis for treatment. Nasal washings and aspirates are unacceptable for Xpert Xpress SARS-CoV-2/FLU/RSV testing.  Fact Sheet for Patients: BloggerCourse.com  Fact Sheet for Healthcare Providers: SeriousBroker.it  This test is not yet approved or cleared by the [place] FDA and has been authorized for detection and/or diagnosis of SARS-CoV-2 by FDA under an Emergency Use Authorization (EUA). This EUA will remain in effect (meaning this test can be used) for the duration of the COVID-19 declaration under Section 564(b)(1) of the Act, 21 U.S.C. section 360bbb-3(b)(1), unless the authorization is terminated or revoked.  Performed at [facility], [address] [place], [place] [address]   MRSA Next Gen by PCR, Nasal     Status: None   Collection Time: September 25, 2020 11:06 PM   Specimen: Nasal Mucosa; Nasal Swab  Result Value Ref Range Status   MRSA by PCR Next Gen NOT DETECTED NOT DETECTED Final    Comment: (NOTE) The GeneXpert MRSA Assay (FDA approved for NASAL specimens only), is one component of a comprehensive MRSA colonization surveillance program. It is not intended to  diagnose MRSA infection nor to guide or monitor treatment for MRSA infections. Test performance is not FDA approved in patients less than 62 years old. Performed at [facility], [address], [place], [place] [address]   Urine Culture     Status: Abnormal   Collection Time: September 25, 2020 11:17 PM   Specimen: In/Out Cath Urine  Result Value Ref Range Status   Specimen Description   Final    IN/OUT CATH URINE Performed at [facility], [address], [place], [place] [address]    Special Requests   Final    NONE Performed at [facility], [address] [place], [place] [address]    Culture MULTIPLE SPECIES PRESENT, SUGGEST RECOLLECTION (A)  Final   Report Status 09/24/2020 FINAL  Final  C Difficile Quick Screen w PCR reflex     Status: None   Collection Time: 09/24/20  1:49 PM   Specimen: STOOL  Result Value Ref Range Status   C Diff antigen NEGATIVE NEGATIVE Final   C Diff toxin NEGATIVE NEGATIVE Final   C Diff interpretation No C. difficile detected.  Final    Comment: VALID Performed at [facility], [address] [place], [place] [address]          Radiology Studies: No results found.      Scheduled Meds:  Chlorhexidine Gluconate Cloth  6 each Topical Daily   heparin  5,000 Units Subcutaneous Q8H    HYDROmorphone (DILAUDID) injection  0.5 mg Intravenous Q6H   insulin aspart  0-15 Units Subcutaneous Q4H   Continuous Infusions:  0.45 % NaCl with KCl 20 mEq / L 125 mL/hr at 09/26/20 1221   ceFEPime (MAXIPIME) IV 2 g (09/26/20 1223)   metronidazole     vancomycin 1,000 mg (09/26/20 0049)     LOS: 4 days    Time spent: 40 min    Silvano Bilis, MD [facility]   If 7PM-7AM, please contact night-coverage [email or web address] Password [facility] 09/26/2020,  4:03 PM

## 2020-09-26 NOTE — Progress Notes (Signed)
Palliative:  HPI: 80 y.o. female  with past medical history of strokes, HFpEF, atrial fibrillation, HTN, DVT, diabetes, CKD, GIB, severe PAD with chronic osteomyelitis and dry gangrene right second toe (previously refused amputation) admitted on 10/06/2020 with altered mental status and fever with known sacral and bilateral foot/heel wounds and found to have septic shock and possible UTI.    I met today at Ms. Chillemi' bedside with her son and daughter. They have noted further decline today as well. We discuss meeting this afternoon when they bring their father back to discuss transition to comfort care.   I met again with Ms. Boak' husband, son, daughter and Dr. Si Raider. We discussed lack of improvement and further decline in clinical status. Discussed expectations that current interventions are no longer beneficial and recommendations for comfort care measures. After some discussion with family they all agree that this is not the type of care Ms. Nordgren would want and they agree that comfort measures is in her best interest. We discussed initiation of comfort measures now, liberalizing comfort medications, and liberalized visitation. Daughter asked me about prognosis in which we discussed anything could happen at any time and it could be hours and it could be a few days. They share that all family should be able to be here by tonight to visit with her.   I escorted family back to room. Provided them with water and tissues. Notified RN and Financial controller of comfort status and request of comfort cart. Chaplain consulted for additional support. All questions/concerns addressed. Emotional support provided.   Exam: Eyes open but no tracking or following commands. Slight movement of right hand non-purposeful. Tachypneic and shallow breathing. Feet with worsening cyanosis/mottling and coolness extending up foot and leg. Fingers/hands also now cold to touch.   Plan: - Full comfort care.  - Scheduled and prn  dilaudid IV. Increase dosage/frequency as needed to achieve comfort.  - Anticipate hospital death.   Herman, NP Palliative Medicine Team Pager [phone] (Please see [email or web address] for schedule) Team Phone [phone]    Greater than 50%  of this time was spent counseling and coordinating care related to the above assessment and plan

## 2020-09-26 NOTE — Progress Notes (Signed)
Provided family end of life education related to signs and symptoms, offered space of meaning meaning, life review, prayer and blessing offered. Family is understanding and accepting of decline at this time.     09/26/20 1900  Clinical Encounter Type  Visited With Patient and family together  Visit Type Spiritual support;Patient actively dying  Referral From Nurse  Spiritual Encounters  Spiritual Needs Prayer;Emotional;Grief support

## 2020-09-26 NOTE — TOC Progression Note (Signed)
Transition of Care [facility]) - Progression Note    Patient Details  Name: Virginia Mann MRN: [id number] Date of Birth: June 13, 1940  Transition [facility]) CM/SW Contact  Barrie Dunker, RN Phone Number: 09/26/2020, 9:03 AM  Clinical Narrative:   The patient was seen by Palliative and will likely transition to Comfort care today, possible Hospice facility appropriate, TOC to continue to follow         Expected Discharge Plan and Services                                                 Social Determinants of Health (SDOH) Interventions    Readmission Risk Interventions No flowsheet data found.

## 2020-09-27 LAB — CULTURE, BLOOD (ROUTINE X 2)
Culture: NO GROWTH
Special Requests: ADEQUATE

## 2020-10-07 NOTE — Death Summary Note (Signed)
  Death Summary  Virginia Mann    DOB: 1940/05/09, 80 y.o.  UJW:[id number]  PCP: Lynwood Dawley, MD   Code Status: DNR   DOA: 2020/09/30   LOS: 5  Brief Narrative of Current Hospitalization  Virginia Mann is a 80 y.o. female who was admitted for sepsis and subsequently placed on comfort care. She passed away early this morning of natural causes and the provider from overnight was notified. Family was notified by nursing staff.   Leeroy Bock, DO [facility] 09/11/2020, 5:07 PM   To contact the [facility] Attending or Consulting provider for this patient: Check the care team in [facility] for a) attending/consulting TRH provider listed and b) the [facility] team listed Log into [email or web address] and use [place]'s universal password to access. If you do not have the password, please contact the hospital operator. Locate the [facility] provider you are looking for under [facility] and page to a number that you can be directly reached. If you still have difficulty reaching the provider, please page the [facility] (Director on Call) for the Hospitalists listed on amion for assistance.

## 2020-10-07 NOTE — Progress Notes (Signed)
Patient is deceased. Dr. Para March notified and patient's daughter, Eber Jones. Daughter states that they do not have a funeral home picked out at this time.

## 2020-10-07 NOTE — Progress Notes (Signed)
  Chaplain On-Call spoke with Nurses at 1100 seeking to learn the patient's Time of Death.  Nurses stated that the time was approximately 0630 this morning.  Chaplain will record this information in the Chaplain Visitation Log.  Chaplain Evelena Peat M.Div., [facility]

## 2020-10-07 NOTE — Care Management Important Message (Signed)
Important Message  Patient Details  Name: Virginia Mann MRN: [id number] Date of Birth: February 28, 1940   Medicare Important Message Given:  Other (see comment)  Placed on comfort care 10/10/20 and passed away this morning.   Olegario Messier A Cloyce Blankenhorn 10/11/2020, 9:03 AM

## 2020-10-07 DEATH — deceased

## 2023-01-11 IMAGING — CT CT HEAD W/O CM
2 series · 15 of 37 positions shown, 18 images · non-contrast
Comparison: CT 02/19/2020

CLINICAL DATA: Mental status change

EXAM:
CT HEAD WITHOUT CONTRAST
TECHNIQUE: Contiguous axial images were obtained from the base of the skull
through the vertex without intravenous contrast.

[Series 7: sagittal soft tissue · sagittal · 0.31mm/px · 3 of 55 slices shown]
[im 22/55  brain]
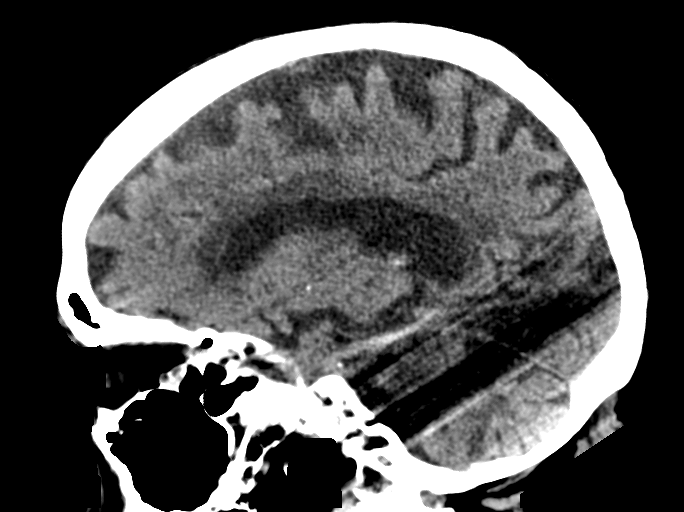
[im 28/55  brain]
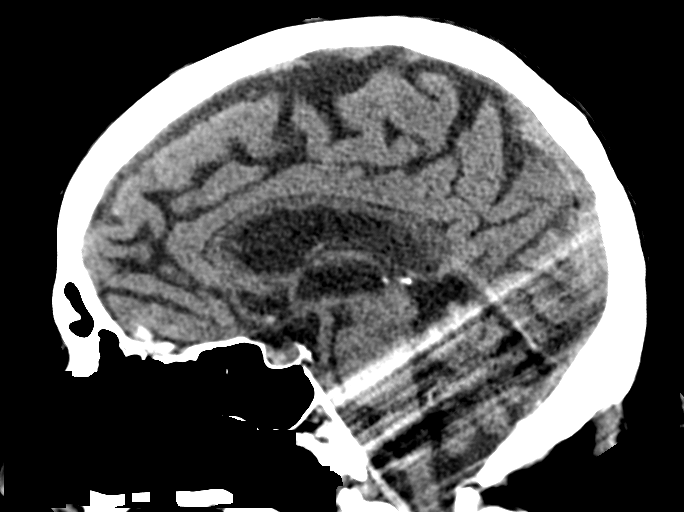
[im 33/55  brain]
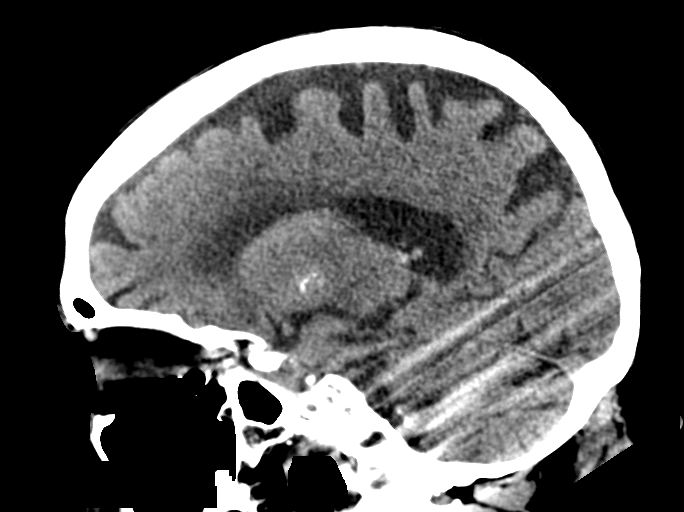

[Series 8: head wo · axial · 0.42mm/px · z∈[-136,+14]mm · 12 of 36 slices shown, 15 images]
[im 3/36  brain]
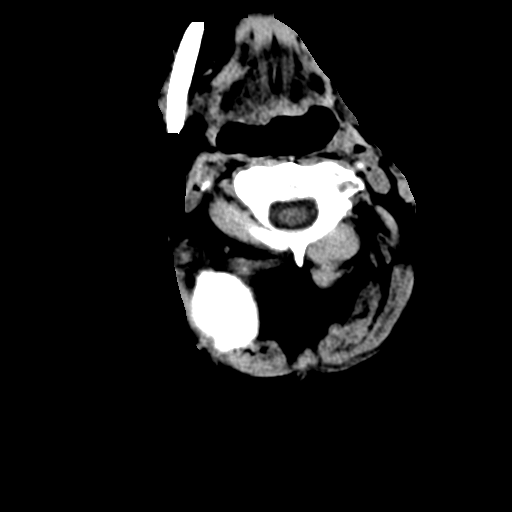
[im 3/36  bone]
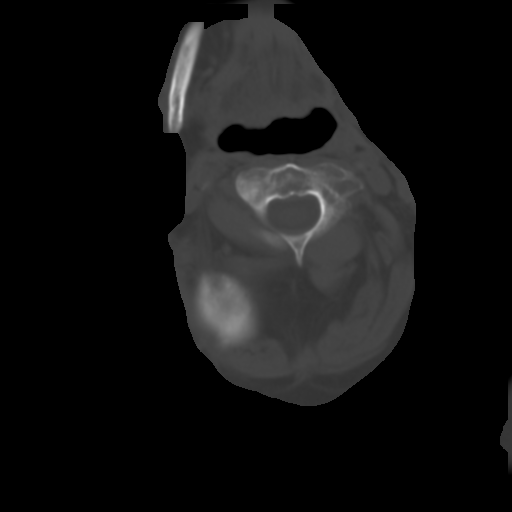
[im 5/36  brain]
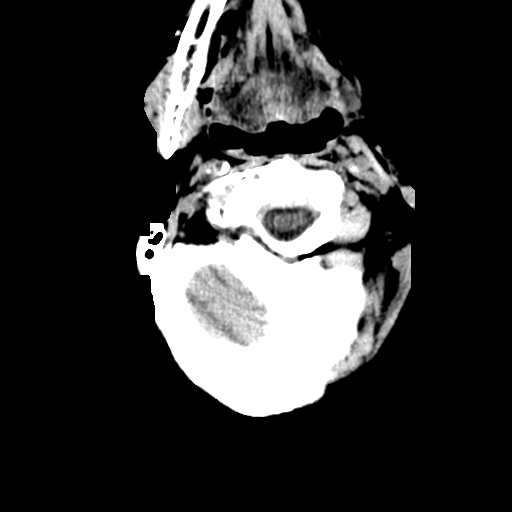
[im 8/36  brain]
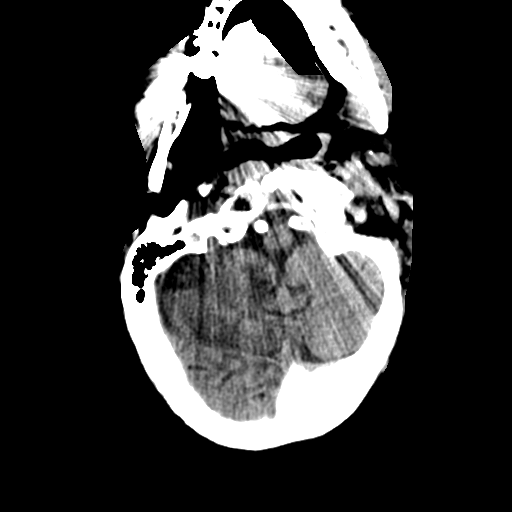
[im 11/36  brain]
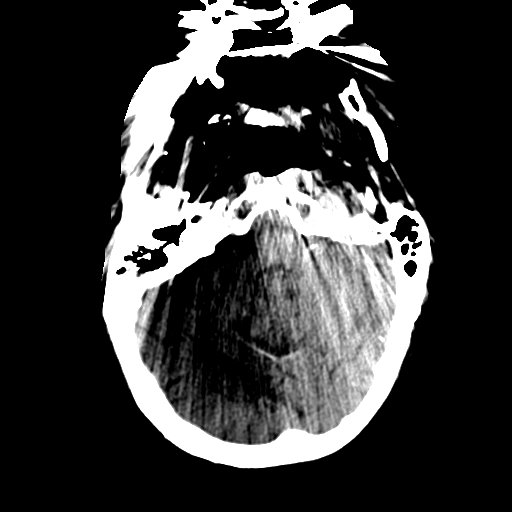
[im 14/36  brain]
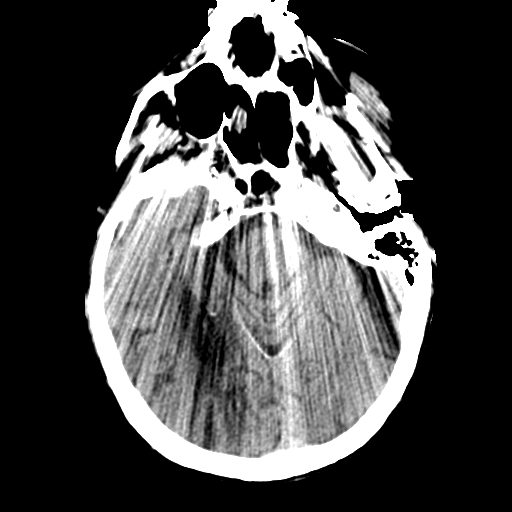
[im 14/36  bone]
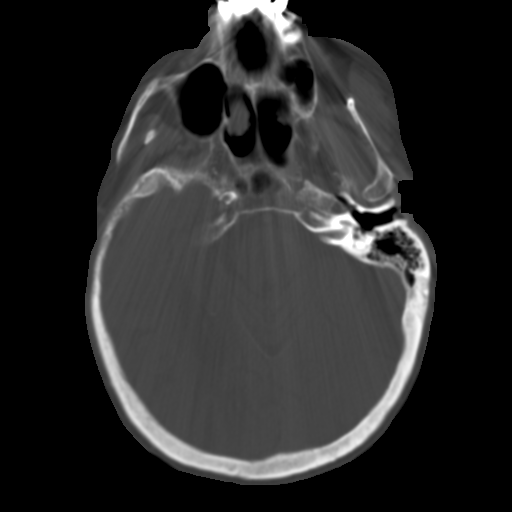
[im 16/36  brain]
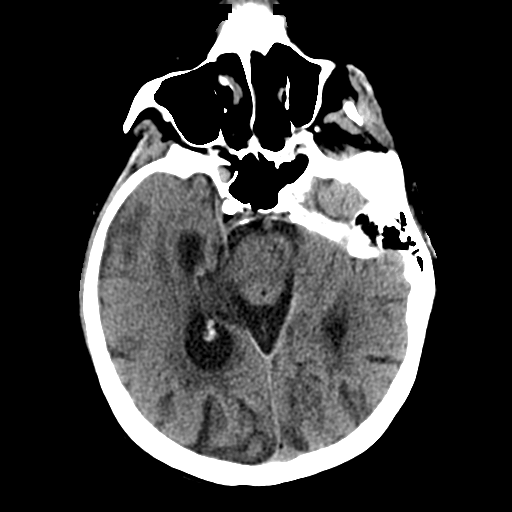
[im 20/36  brain]
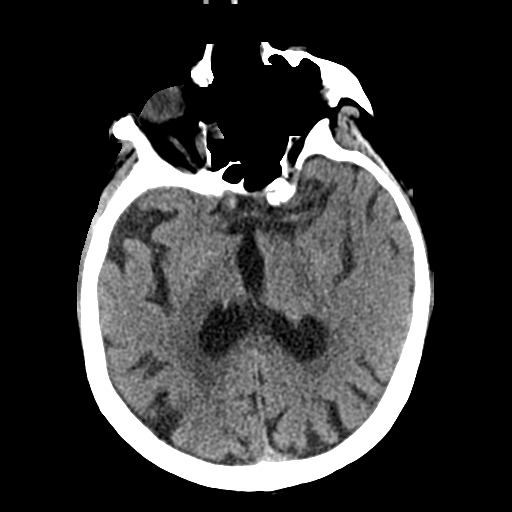
[im 22/36  brain]
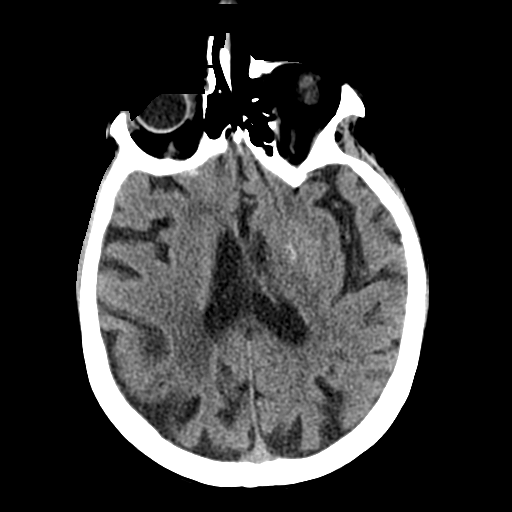
[im 25/36  brain]
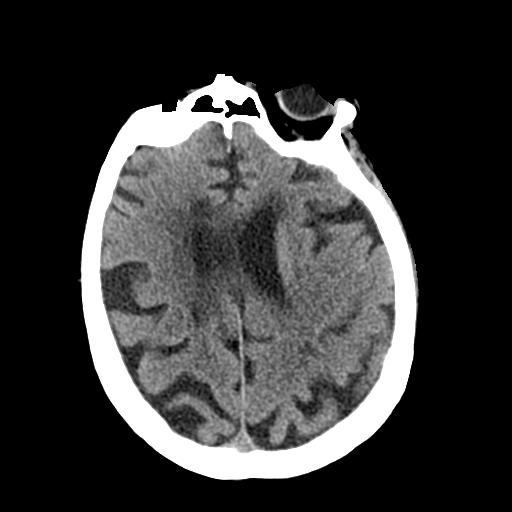
[im 25/36  bone]
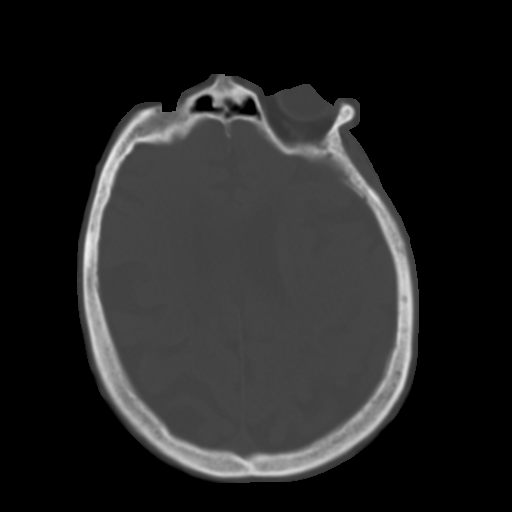
[im 28/36  brain]
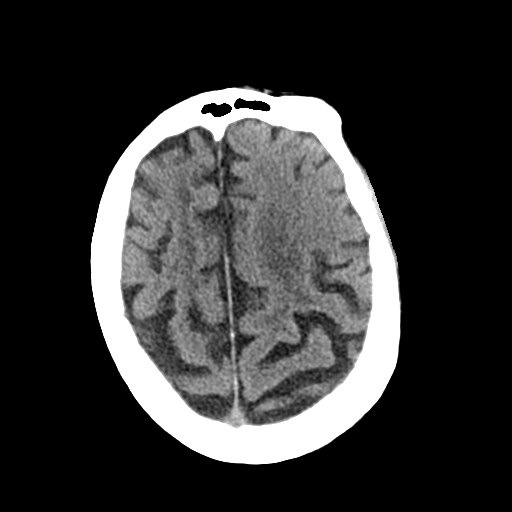
[im 31/36  brain]
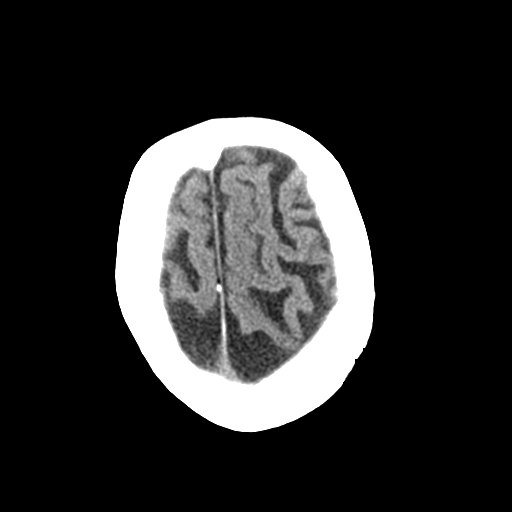
[im 33/36  brain]
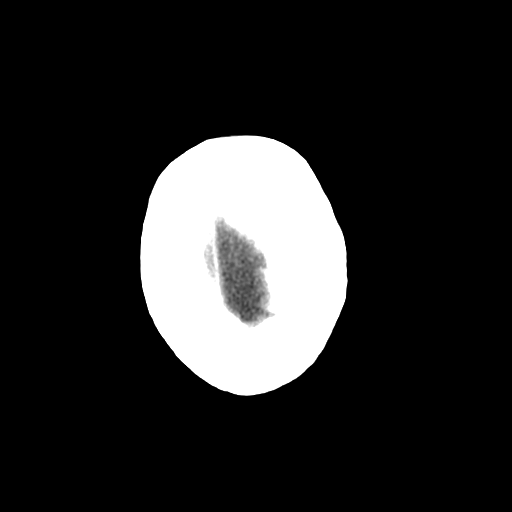

[15 of 37 positions shown; findings below may reference images not displayed]

FINDINGS: Brain: Limited by extensive artifact with obscured posterior fossa,
posterior temporal lobes and occipital lobes. No gross hemorrhage or
midline shift. Chronic right occipital lobe infarct. Probable small
chronic right cerebellar infarcts. Interval encephalomalacia at the
right posterior frontal vertex, series 3, image 63 consistent with
chronic infarct, but new as compared with Es CT. Age
indeterminate hypodensity within the white matter adjacent to the
frontal horn, series 3, image 41. Moderate severe atrophy with
chronic small vessel ischemic changes of the white matter.
Nonenlarged ventricles.

Vascular: Slightly limited assessment due to artifact. Vertebral and
carotid vascular calcification.

Skull: No fracture

Sinuses/Orbits: Mucosal thickening in the sinuses

Other: None
IMPRESSION: 1. The study is limited by extensive artifact. No gross intracranial
hemorrhage is seen
2. Age indeterminate hypodensity/suspected small infarct within the
white matter adjacent to the right frontal horn, new since [DATE]. Chronic right occipital infarct. Chronic appearing infarct at the
right frontal vertex but new since February 2020. Probable multiple
chronic infarcts in the right cerebellum
4. Atrophy with chronic small-vessel ischemic changes of the white
matter
